# Patient Record
Sex: Male | Born: 1975 | Race: White | Hispanic: No | Marital: Married | Smoking: Never smoker
Health system: Southern US, Community
[De-identification: ages and names within clinical notes are randomized; demographics above are authoritative.]

## PROBLEM LIST (undated history)

## (undated) DIAGNOSIS — Z8619 Personal history of other infectious and parasitic diseases: Secondary | ICD-10-CM

## (undated) DIAGNOSIS — I499 Cardiac arrhythmia, unspecified: Secondary | ICD-10-CM

## (undated) DIAGNOSIS — N200 Calculus of kidney: Secondary | ICD-10-CM

## (undated) DIAGNOSIS — K219 Gastro-esophageal reflux disease without esophagitis: Secondary | ICD-10-CM

## (undated) HISTORY — DX: Gastro-esophageal reflux disease without esophagitis: K21.9

## (undated) HISTORY — DX: Personal history of other infectious and parasitic diseases: Z86.19

## (undated) HISTORY — DX: Calculus of kidney: N20.0

---
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
## 2011-05-22 ENCOUNTER — Ambulatory Visit: Payer: Self-pay | Admitting: Family Medicine

## 2011-06-05 ENCOUNTER — Ambulatory Visit (INDEPENDENT_AMBULATORY_CARE_PROVIDER_SITE_OTHER): Payer: BC Managed Care – PPO | Admitting: Family Medicine

## 2011-06-05 ENCOUNTER — Encounter: Payer: Self-pay | Admitting: Family Medicine

## 2011-06-05 DIAGNOSIS — R42 Dizziness and giddiness: Secondary | ICD-10-CM | POA: Insufficient documentation

## 2011-06-05 DIAGNOSIS — R000 Tachycardia, unspecified: Secondary | ICD-10-CM

## 2011-06-05 NOTE — Progress Notes (Signed)
New pt.  Inc in pulse with exercise, over last few years.  He cut out supplements and caffeine and that didn't help.  HR got up to ~190s with exercise.  He had a steady increase and then steady decrease in pulse with exercise and rest, respectively.  Occ chest pain, tightness, now that is very brief, a few seconds.  Can be nonexertional and this appears unrelated to the exercise/pulse issue above.  H/o heartburn. No syncope.  No history of early cardiac death in family.  No known CAD or valvular disorder in patient.  When he runs, he warms up on treadmill and then goes at about 7 miles an hour (he goes up for about 10 minutes).  No h/o asthma.  Nonsmoker.   Dizzy spells about 1 month ago.  Room spin when moving from laying to sitting.  Lasted ~2 days.  Now resolved.  Will happen occ, every few months.  No focal neuro changes.  Took dramamine, it made him sleepy.   PMH and SH reviewed  ROS: See HPI, otherwise noncontributory.  Meds, vitals, and allergies reviewed.   GEN: nad, alert and oriented HEENT: mucous membranes moist NECK: supple w/o LA CV: rrr.  no murmur PULM: ctab, no inc wob ABD: soft, +bs EXT: no edema SKIN: no acute rash CN 2-12 wnl B, S/S/DTR wnl x4 DHP wnl x2  EKG wnl.

## 2011-06-05 NOTE — Patient Instructions (Signed)
Keep exercising but double your warm up time and shoot for a max heart rate of ~160.  I think you have BPV (benign positional vertigo) and the bedside exercises can help. Let me know if you have other concerns.  Take care.

## 2011-06-06 ENCOUNTER — Encounter: Payer: Self-pay | Admitting: Family Medicine

## 2011-06-06 NOTE — Assessment & Plan Note (Signed)
Benign exam, likely BPV.  D/w pt about otc meds and bedside exercises.  F/u prn.

## 2011-06-06 NOTE — Assessment & Plan Note (Signed)
ekg wnl and normal exam.  I think this may be related to relative deconditioning and exacerbated by a brief warm up phase. Inc warm up time and plan for target max HR ~160 to keep pt in aerobic range and then f/u prn.  I think his is okay for exercise.  He'll call back if he has troubles.

## 2011-07-28 ENCOUNTER — Ambulatory Visit: Payer: BC Managed Care – PPO | Admitting: Family Medicine

## 2011-07-28 ENCOUNTER — Ambulatory Visit (INDEPENDENT_AMBULATORY_CARE_PROVIDER_SITE_OTHER): Payer: BC Managed Care – PPO | Admitting: Family Medicine

## 2011-07-28 ENCOUNTER — Encounter: Payer: Self-pay | Admitting: Family Medicine

## 2011-07-28 DIAGNOSIS — J069 Acute upper respiratory infection, unspecified: Secondary | ICD-10-CM

## 2011-07-28 NOTE — Patient Instructions (Signed)
Drink plenty of fluids, take tylenol as needed, and gargle with warm salt water for your throat.  Take theraflu in the meantime and warm up your voice before the show. This should gradually improve.  Take care.  Let us know if you have other concerns.

## 2011-07-28 NOTE — Assessment & Plan Note (Signed)
With cough, no exudates/LA and ST likely related to cough, this is highly unlikely to be strep.  Most likely viral process. Supportive tx, voice rest, gargle with salt water and get a flu shot when resolved.  He agrees.  ddx d/w pt.  He is well appearing.

## 2011-07-28 NOTE — Progress Notes (Signed)
duration of symptoms: ~3 days Rhinorrhea: yes Congestion: yes ear pain: no sore throat: yes, from cough Cough:yes, some production Myalgias:occ backaches, better now other concerns: some sinus pressure.  Chest sore with cough.  Dec in appetite.  No fevers known, but felt warm.  occ chills.   All sx worse at night.    ROS: See HPI.  Otherwise negative.    Meds, vitals, and allergies reviewed.   GEN: nad, alert and oriented HEENT: mucous membranes moist, TM w/o erythema, nasal epithelium mildly injected, OP with cobblestoning but no exudates NECK: supple w/o LA CV: rrr. PULM: ctab, no inc wob ABD: soft, +bs EXT: no edema

## 2011-08-15 ENCOUNTER — Ambulatory Visit: Payer: BC Managed Care – PPO | Admitting: Family Medicine

## 2011-12-23 ENCOUNTER — Ambulatory Visit (INDEPENDENT_AMBULATORY_CARE_PROVIDER_SITE_OTHER): Payer: BC Managed Care – PPO | Admitting: Family Medicine

## 2011-12-23 ENCOUNTER — Encounter: Payer: Self-pay | Admitting: Family Medicine

## 2011-12-23 VITALS — BP 118/80 | HR 88 | Temp 97.3°F | Wt 185.2 lb

## 2011-12-23 DIAGNOSIS — M7989 Other specified soft tissue disorders: Secondary | ICD-10-CM

## 2011-12-23 MED ORDER — NAPROXEN 500 MG PO TABS: ORAL_TABLET | ORAL | Status: DC

## 2011-12-23 NOTE — Progress Notes (Signed)
  Subjective:    Patient ID: Roger Reed, male    DOB: 11/30/1975, 36 y.o.   MRN: [id number]  HPI CC: check lump R arm  Plays guitar - for a living.  Last week 9 hours worth of playing straight (several shows), then noticed knot develop.  When plays worse, when stops playing feels better.  Feels some grind as day progresses but not in mornings.    Has tried ibuprofen, didn't really see improvement in size.  No ice so far.  Also works at [employer] shot - repetitive hand motions.  Never previous similar episode.  No recent skin infections, cuts, fevers, bruising, no spreading redness.  No erythema at all.  Review of Systems Per HPI    Objective:   Physical Exam  Nursing note and vitals reviewed. Constitutional: He appears well-developed and well-nourished. No distress.  Musculoskeletal: He exhibits no edema.       Swelling present right lateral distal forearm, sore to palpation. + finklestein's on right No pain with testing wrist flexors/extendors against resistance. FROM at wrists bilaterally. No pain at wrist, CMCs, 1st MCP or rest of hand. No anatomical snuffbox tenderness  Neurological: He has normal strength. No sensory deficit.       Assessment & Plan:

## 2011-12-23 NOTE — Assessment & Plan Note (Signed)
No injury. + repetitive motions.  Anticipate wrist extensor tendonitis.   Treat as such with ice, NSAIDs, rest. If not improving, consider forearm xray to r/o fx. Called and advised to use cockup wrist brace as well for further rest/protection.

## 2011-12-23 NOTE — Patient Instructions (Addendum)
I think you have wrist extensor tendinitis - treat with naprosyn twice daily for 5 days with food then as needed.  Rest arm as able. Ice to area as well. If not improving, let us know. Good to see you today, call us with quesitons.  Tendinitis Tendinitis is swelling and inflammation of the tendons. Tendons are band-like tissues that connect muscle to bone. Tendinitis commonly occurs in the:   Shoulders (rotator cuff).   Heels (Achilles tendon).   Elbows (triceps tendon).  CAUSES Tendinitis is usually caused by overusing the tendon, muscles, and joints involved. When the tissue surrounding a tendon (synovium) becomes inflamed, it is called tenosynovitis. Tendinitis commonly develops in people whose jobs require repetitive motions. SYMPTOMS  Pain.   Tenderness.   Mild swelling.  DIAGNOSIS Tendinitis is usually diagnosed by physical exam. Your caregiver may also order X-rays or other imaging tests. TREATMENT Your caregiver may recommend certain medicines or exercises for your treatment. HOME CARE INSTRUCTIONS   Use a sling or splint for as long as directed by your caregiver until the pain decreases.   Put ice on the injured area.   Put ice in a plastic bag.   Place a towel between your skin and the bag.   Leave the ice on for 15 to 20 minutes, 3 to 4 times a day.   Avoid using the limb while the tendon is painful. Perform gentle range of motion exercises only as directed by your caregiver. Stop exercises if pain or discomfort increase, unless directed otherwise by your caregiver.   Only take over-the-counter or prescription medicines for pain, discomfort, or fever as directed by your caregiver.  SEEK MEDICAL CARE IF:   Your pain and swelling increase.   You develop new, unexplained symptoms, especially increased numbness in the hands.  MAKE SURE YOU:   Understand these instructions.   Will watch your condition.   Will get help right away if you are not doing well or  get worse.  Document Released: 08/15/2000 Document Revised: 08/07/2011 Document Reviewed: 11/04/2010 [facility] Patient Information 2012 [place], [place].

## 2012-04-22 ENCOUNTER — Encounter: Payer: Self-pay | Admitting: Family Medicine

## 2012-04-22 ENCOUNTER — Ambulatory Visit (INDEPENDENT_AMBULATORY_CARE_PROVIDER_SITE_OTHER): Payer: BC Managed Care – PPO | Admitting: Family Medicine

## 2012-04-22 VITALS — BP 104/80 | HR 80 | Temp 98.2°F | Wt 184.0 lb

## 2012-04-22 DIAGNOSIS — M549 Dorsalgia, unspecified: Secondary | ICD-10-CM

## 2012-04-22 MED ORDER — PREDNISONE 20 MG PO TABS: ORAL_TABLET | ORAL | Status: AC

## 2012-04-22 MED ORDER — CYCLOBENZAPRINE HCL 10 MG PO TABS: 10.0000 mg | ORAL_TABLET | Freq: Three times a day (TID) | ORAL | Status: AC | PRN

## 2012-04-22 MED ORDER — TRAMADOL HCL 50 MG PO TABS: 50.0000 mg | ORAL_TABLET | Freq: Four times a day (QID) | ORAL | Status: AC | PRN

## 2012-04-22 NOTE — Progress Notes (Signed)
[facility] [address] [place] [address] Phone: [phone] Fax: [phone]  Date:  04/22/2012   Name:  Roger Reed   DOB:  05-30-1976   MRN:  [id number] Gender: male  Age: 36 y.o.  PCP:  Crawford Givens, MD    Chief Complaint: Pain across lower back for about a week and a half, since mo   History of Present Illness:  Roger Reed is a 36 y.o. very pleasant male patient who presents with the following:  Several years ago, hurt his back, and was moving and lifting some things out of the car, and missed worka couple of days, and has a back brace and on his feet all day long. Having to push off and hurts to lift  All in low back.  In buttocks.  He describes no numbness, tingling, or radiculopathy. He is having no bowel or bladder incontinence. He is having some significant amount of pain, and is limiting his ability to return to work and feel at full function.  He is able to move but is having some difficulty even standing from a chair and going up and down stairs.  He has been using a back brace and been doing some occasional stretches. Other than this he is tried a few anti-inflammatories, but otherwise has not done much  Past Medical History, Surgical History, Social History, Family History, Problem List, Medications, and Allergies have been reviewed and updated if relevant.  No current outpatient prescriptions on file prior to visit.    Review of Systems:  GEN: No fevers, chills. Nontoxic. Primarily MSK c/o today. MSK: Detailed in the HPI GI: tolerating PO intake without difficulty Neuro: No numbness, parasthesias, or tingling associated. Otherwise the pertinent positives of the ROS are noted above.    Physical Examination: Filed Vitals:   04/22/12 1206  BP: 104/80  Pulse: 80  Temp: 98.2 F (36.8 C)   Filed Vitals:   04/22/12 1206  Weight: 184 lb (83.462 kg)   There is no height on file to calculate BMI. Ideal Body  Weight:     GEN: Well-developed,well-nourished,in no acute distress; alert,appropriate and cooperative throughout examination HEENT: Normocephalic and atraumatic without obvious abnormalities. Ears, externally no deformities PULM: Breathing comfortably in no respiratory distress EXT: No clubbing, cyanosis, or edema PSYCH: Normally interactive. Cooperative during the interview. Pleasant. Friendly and conversant. Not anxious or depressed appearing. Normal, full affect.  Range of motion at  the waist: Mild restriction in all directions, the patient is able to achieve approximately 60 of forward flexion. Extension causes some pain.  No echymosis or edema Rises to examination table with mild difficulty Gait: non antalgic  Inspection/Deformity: N Paraspinus Tenderness: l3-s1  B Ankle Dorsiflexion (L5,4): 5/5 B Great Toe Dorsiflexion (L5,4): 5/5 Heel Walk (L5): WNL Toe Walk (S1): WNL Rise/Squat (L4): arms to assist, some pain  SENSORY B Medial Foot (L4): WNL B Dorsum (L5): WNL B Lateral (S1): WNL Light Touch: WNL Pinprick: WNL  REFLEXES Knee (L4): 2+ Ankle (S1): 2+  B SLR, seated: neg B SLR, supine: neg B FABER: neg B Reverse FABER: neg B Greater Troch: NT B Log Roll: neg B Stork: NT B Sciatic Notch: TTP   Assessment and Plan:  1. Back pain    No red flags, we'll treat conservatively at this point. The patient would like to be relatively aggressive if at all possible so he can return to full function as soon as possible. I may give  him a ten-day burst taper of steroids at 40/20 of prednisone, some tramadol as needed, and Flexeril at nighttime.  He is going to followup with me in a few weeks if he is not improved  Orders Today:  No orders of the defined types were placed in this encounter.    Medications Today: (Includes new updates added during medication reconciliation) Meds ordered this encounter  Medications  . predniSONE (DELTASONE) 20 MG tablet    Sig: 2  tabs po for 5 days, then 1 po for 5 days    Dispense:  15 tablet    Refill:  0  . cyclobenzaprine (FLEXERIL) 10 MG tablet    Sig: Take 1 tablet (10 mg total) by mouth 3 (three) times daily as needed for muscle spasms.    Dispense:  30 tablet    Refill:  2  . traMADol (ULTRAM) 50 MG tablet    Sig: Take 1 tablet (50 mg total) by mouth every 6 (six) hours as needed for pain.    Dispense:  50 tablet    Refill:  0    Medications Discontinued: Medications Discontinued During This Encounter  Medication Reason  . naproxen (NAPROSYN) 500 MG tablet Error     Hannah Beat, MD

## 2012-05-04 ENCOUNTER — Encounter: Payer: Self-pay | Admitting: Family Medicine

## 2012-05-04 ENCOUNTER — Ambulatory Visit (INDEPENDENT_AMBULATORY_CARE_PROVIDER_SITE_OTHER): Payer: BC Managed Care – PPO | Admitting: Family Medicine

## 2012-05-04 VITALS — BP 110/70 | HR 100 | Temp 98.1°F | Wt 178.5 lb

## 2012-05-04 DIAGNOSIS — M549 Dorsalgia, unspecified: Secondary | ICD-10-CM

## 2012-05-04 MED ORDER — IBUPROFEN 200 MG PO TABS: 600.0000 mg | ORAL_TABLET | Freq: Three times a day (TID) | ORAL | Status: AC | PRN

## 2012-05-04 MED ORDER — HYDROCODONE-ACETAMINOPHEN 5-325 MG PO TABS: 1.0000 | ORAL_TABLET | Freq: Four times a day (QID) | ORAL | Status: AC | PRN

## 2012-05-04 NOTE — Progress Notes (Signed)
Was moving equipment and felt back pain while lifting.  Was leaning over and lifting.  Quick onset of pain.  Prev seen by Dr. Patsy Lager and was improved with tramadol, flexeril and prednisone.  Now off the prednisone and pain has returned.  The current pain is the same as prev at [facility] with Dr. Patsy Lager.  He's been driving (long road trip) and he pain increased after that.  No bowel/bladder dysfxn.  No leg weakness but pain limits ROM.  No radicular pain in legs.  Pain is in B lower back, near the tailbone.    Meds, vitals, and allergies reviewed.   ROS: See HPI.  Otherwise, noncontributory.  nad but uncomfortable rrr ctab Back w/o midline pain except at the lower L spine.  B paraspinal muscle tenderness in L spine with neg SLR No rash No weakness in BLE

## 2012-05-04 NOTE — Patient Instructions (Signed)
Keep stretching, take the vicodin and ibuprofen for pain.  See Shirlee Limerick about your referral before you leave today. Take care.

## 2012-05-05 DIAGNOSIS — M549 Dorsalgia, unspecified: Secondary | ICD-10-CM | POA: Insufficient documentation

## 2012-05-05 NOTE — Assessment & Plan Note (Signed)
Worsened, will use vicodin with sedation caution in meantime and refer to ortho.  Pt agrees with plan.  I will defer imaging to ortho.

## 2012-10-22 ENCOUNTER — Emergency Department: Payer: Self-pay | Admitting: Emergency Medicine

## 2012-10-22 LAB — COMPREHENSIVE METABOLIC PANEL
Albumin: 5 g/dL (ref 3.4–5.0)
Anion Gap: 9 (ref 7–16)
Bilirubin,Total: 0.9 mg/dL (ref 0.2–1.0)
Calcium, Total: 9.7 mg/dL (ref 8.5–10.1)
Co2: 23 mmol/L (ref 21–32)
EGFR (African American): >60
Glucose: 160 mg/dL — ABNORMAL HIGH (ref 65–99)

## 2012-10-22 LAB — CBC
MCHC: 34.4 g/dL (ref 32.0–36.0)
MCV: 89 fL (ref 80–100)

## 2012-11-25 ENCOUNTER — Encounter: Payer: Self-pay | Admitting: Family Medicine

## 2012-11-25 ENCOUNTER — Telehealth: Payer: Self-pay | Admitting: *Deleted

## 2012-11-25 ENCOUNTER — Ambulatory Visit: Payer: BC Managed Care – PPO | Admitting: Family Medicine

## 2012-11-25 ENCOUNTER — Ambulatory Visit (INDEPENDENT_AMBULATORY_CARE_PROVIDER_SITE_OTHER): Payer: BC Managed Care – PPO | Admitting: Family Medicine

## 2012-11-25 VITALS — BP 140/100 | HR 88 | Temp 97.9°F | Wt 176.5 lb

## 2012-11-25 DIAGNOSIS — N200 Calculus of kidney: Secondary | ICD-10-CM

## 2012-11-25 DIAGNOSIS — R109 Unspecified abdominal pain: Secondary | ICD-10-CM

## 2012-11-25 LAB — POCT URINALYSIS DIPSTICK
Ketones, UA: NEGATIVE
Protein, UA: 30
Spec Grav, UA: 1.02
Urobilinogen, UA: 0.2
pH, UA: 6.5

## 2012-11-25 MED ORDER — TAMSULOSIN HCL 0.4 MG PO CAPS: 0.4000 mg | ORAL_CAPSULE | Freq: Every day | ORAL | Status: DC

## 2012-11-25 MED ORDER — NAPROXEN 500 MG PO TABS: ORAL_TABLET | ORAL | Status: DC

## 2012-11-25 MED ORDER — HYDROCODONE-ACETAMINOPHEN 5-325 MG PO TABS: 1.0000 | ORAL_TABLET | Freq: Four times a day (QID) | ORAL | Status: DC | PRN

## 2012-11-25 NOTE — Patient Instructions (Signed)
You have repeat kidney stone. Treat with daily flomax for next several days until you pass stone, Naprosyn anti inflammatory with food twice daily for next several days. vicodin as needed for breakthrough pain - don't drive with this. Strain urine. Please let us know if not improving as expected. If worsening pain or other concerning symptoms like fever, nausea/vomiting unable to keep food down, please seek urgent care.  Kidney Stones Kidney stones (ureteral lithiasis) are deposits that form inside your kidneys. The intense pain is caused by the stone moving through the urinary tract. When the stone moves, the ureter goes into spasm around the stone. The stone is usually passed in the urine.  CAUSES   A disorder that makes certain neck glands produce too much parathyroid hormone (primary hyperparathyroidism).  A buildup of uric acid crystals.  Narrowing (stricture) of the ureter.  A kidney obstruction present at birth (congenital obstruction).  Previous surgery on the kidney or ureters.  Numerous kidney infections. SYMPTOMS   Feeling sick to your stomach (nauseous).  Throwing up (vomiting).  Blood in the urine (hematuria).  Pain that usually spreads (radiates) to the groin.  Frequency or urgency of urination. DIAGNOSIS   Taking a history and physical exam.  Blood or urine tests.  Computerized X-ray scan (CT scan).  Occasionally, an examination of the inside of the urinary bladder (cystoscopy) is performed. TREATMENT   Observation.  Increasing your fluid intake.  Surgery may be needed if you have severe pain or persistent obstruction. The size, location, and chemical composition are all important variables that will determine the proper choice of action for you. Talk to your caregiver to better understand your situation so that you will minimize the risk of injury to yourself and your kidney.  HOME CARE INSTRUCTIONS   Drink enough water and fluids to keep your  urine clear or pale yellow.  Strain all urine through the provided strainer. Keep all particulate matter and stones for your caregiver to see. The stone causing the pain may be as small as a grain of salt. It is very important to use the strainer each and every time you pass your urine. The collection of your stone will allow your caregiver to analyze it and verify that a stone has actually passed.  Only take over-the-counter or prescription medicines for pain, discomfort, or fever as directed by your caregiver.  Make a follow-up appointment with your caregiver as directed.  Get follow-up X-rays if required. The absence of pain does not always mean that the stone has passed. It may have only stopped moving. If the urine remains completely obstructed, it can cause loss of kidney function or even complete destruction of the kidney. It is your responsibility to make sure X-rays and follow-ups are completed. Ultrasounds of the kidney can show blockages and the status of the kidney. Ultrasounds are not associated with any radiation and can be performed easily in a matter of minutes. SEEK IMMEDIATE MEDICAL CARE IF:   Pain cannot be controlled with the prescribed medicine.  You have a fever.  The severity or intensity of pain increases over 18 hours and is not relieved by pain medicine.  You develop a new onset of abdominal pain.  You feel faint or pass out. MAKE SURE YOU:   Understand these instructions.  Will watch your condition.  Will get help right away if you are not doing well or get worse. Document Released: 08/18/2005 Document Revised: 11/10/2011 Document Reviewed: 12/14/2009 [facility] Patient Information 2013 [place],  [employer].  

## 2012-11-25 NOTE — Telephone Encounter (Signed)
Patient's wife says they think pt has a kidney stone.  First appt available is 3 pm.  They are asking if they could come sooner?  Please advise.

## 2012-11-25 NOTE — Assessment & Plan Note (Signed)
See pt instructions for plan - NSAID, narcotic, flomax, strainer. Push fluids. Discussed treatment plan and red flags to seek urgent care. H/o kidney stones in past.

## 2012-11-25 NOTE — Telephone Encounter (Signed)
Does anyone else have a spot? O/w 12:30 add on.

## 2012-11-25 NOTE — Telephone Encounter (Signed)
I spoke with patient's wife and scheduled appointment today with Dr.Gutierrez at 11:15.

## 2012-11-25 NOTE — Progress Notes (Signed)
  Subjective:    Patient ID: Roger Reed, male    DOB: 02/17/1976, 37 y.o.   MRN: [id number]  HPI CC: ?kidney stone  Pleasant 37 yo with h/o nephrolithiasis (last 2007) presents with 1d h/o dull ache that started in R testicle/groin that radiated to R side.  Intermittent in nature.  Prior kidney stone presented similarly.  Prior stone passed on its own.  Noticing weakened stream.  Has voided x2 today.  Pain causes nausea.  Treating with naproxyn 500mg .  No hematuria.  No dysuria.  No fevers/chills.    BP elevated today, in pain.  Trying to drink more water.  Trying to cut back on mountain dew.  Has been drinking more herbal tea recently.  Past Medical History  Diagnosis Date  . GERD (gastroesophageal reflux disease)   . Kidney stones   . History of chicken pox      Review of Systems Per HPI    Objective:   Physical Exam  Nursing note and vitals reviewed. Constitutional: He appears well-developed and well-nourished. No distress.  Abdominal: Normal appearance and bowel sounds are normal. He exhibits no distension and no mass. There is no hepatosplenomegaly. There is no tenderness. There is no rebound, no guarding and no CVA tenderness.  Musculoskeletal: He exhibits no edema.       Assessment & Plan:

## 2012-12-13 ENCOUNTER — Other Ambulatory Visit: Payer: Self-pay

## 2012-12-13 MED ORDER — HYDROCODONE-ACETAMINOPHEN 5-325 MG PO TABS: 1.0000 | ORAL_TABLET | Freq: Four times a day (QID) | ORAL | Status: DC | PRN

## 2012-12-13 NOTE — Telephone Encounter (Signed)
Please call in.   If not passed/improved/resolved, then needs recheck.

## 2012-12-13 NOTE — Telephone Encounter (Signed)
Pt request refill on hydrocodone apap; on and off pt still has RLQ pain in abdomen; not sure if passed stone or not.[facility].Please advise.

## 2012-12-14 NOTE — Telephone Encounter (Signed)
LMOVM to call for appt if no improvement.

## 2013-01-26 ENCOUNTER — Other Ambulatory Visit: Payer: Self-pay

## 2013-01-26 MED ORDER — HYDROCODONE-ACETAMINOPHEN 5-325 MG PO TABS: 1.0000 | ORAL_TABLET | Freq: Four times a day (QID) | ORAL | Status: DC | PRN

## 2013-01-26 MED ORDER — NAPROXEN 500 MG PO TABS: ORAL_TABLET | ORAL | Status: DC

## 2013-01-26 MED ORDER — TAMSULOSIN HCL 0.4 MG PO CAPS: 0.4000 mg | ORAL_CAPSULE | Freq: Every day | ORAL | Status: DC

## 2013-01-26 NOTE — Telephone Encounter (Signed)
Spoke with patient. Never passed stone after seen here 11/25/2012 for nephrolithiasis but his sxs resolved a few days afterwards.  Will treat presumed kidney stone with another course of vicodin, flomax, and naprosyn. Discussed will need eval next week if persistent sxs/pain past weekend.   To seek urgent care if fever or trouble voiding or uncontrolled pain. Pt agrees with plan.

## 2013-01-26 NOTE — Telephone Encounter (Signed)
pts wife said pt has hx of kidney stones; on 01/25/13 pt began to hurt like a kidney stone, no fever (Mrs. Vanscyoc is not sure where pain is; pt is at work and has had so many kidney stones Mrs. Frick said he knows when he is getting a kidney stone).No available appt today. Pt is going to [place] 01/27/13 in early AM and will not be back until the weekend. Pt's wife request refill hydrocodone apap to [facility].Pt has a strainer for urine. Mrs. Comas request call back.

## 2013-01-26 NOTE — Telephone Encounter (Signed)
pts wife called back to ck status of pain med.Please advise.

## 2013-02-15 ENCOUNTER — Ambulatory Visit (INDEPENDENT_AMBULATORY_CARE_PROVIDER_SITE_OTHER): Payer: BC Managed Care – PPO | Admitting: Family Medicine

## 2013-02-15 ENCOUNTER — Encounter: Payer: Self-pay | Admitting: Family Medicine

## 2013-02-15 ENCOUNTER — Other Ambulatory Visit: Payer: Self-pay

## 2013-02-15 VITALS — BP 128/82 | HR 88 | Temp 98.0°F | Wt 174.8 lb

## 2013-02-15 DIAGNOSIS — R109 Unspecified abdominal pain: Secondary | ICD-10-CM | POA: Insufficient documentation

## 2013-02-15 DIAGNOSIS — R1031 Right lower quadrant pain: Secondary | ICD-10-CM | POA: Insufficient documentation

## 2013-02-15 DIAGNOSIS — N200 Calculus of kidney: Secondary | ICD-10-CM

## 2013-02-15 LAB — POCT URINALYSIS DIPSTICK
Bilirubin, UA: NEGATIVE
Ketones, UA: NEGATIVE
Nitrite, UA: NEGATIVE
Protein, UA: NEGATIVE
pH, UA: 6.5

## 2013-02-15 MED ORDER — NAPROXEN 500 MG PO TABS: ORAL_TABLET | ORAL | Status: DC

## 2013-02-15 MED ORDER — CIPROFLOXACIN HCL 500 MG PO TABS: 500.0000 mg | ORAL_TABLET | Freq: Two times a day (BID) | ORAL | Status: DC

## 2013-02-15 MED ORDER — HYDROCODONE-ACETAMINOPHEN 5-325 MG PO TABS: 1.0000 | ORAL_TABLET | Freq: Four times a day (QID) | ORAL | Status: DC | PRN

## 2013-02-15 MED ORDER — TAMSULOSIN HCL 0.4 MG PO CAPS: 0.4000 mg | ORAL_CAPSULE | Freq: Every day | ORAL | Status: DC

## 2013-02-15 NOTE — Telephone Encounter (Signed)
Pt's wife left v/m; pt has another kidney stone; has appt today at 4:15 pm but request hydrocodone apap to [facility] until seen.Please advise.

## 2013-02-15 NOTE — Telephone Encounter (Signed)
plz phone in while pt comes in this afternoon.

## 2013-02-15 NOTE — Progress Notes (Signed)
  Subjective:    Patient ID: Roger Reed, male    DOB: 1976/08/03, 37 y.o.   MRN: [id number]  HPI CC: recurrent kidney stone  Seen here 10/2012 with recurrent R kidney stone (prior one was in 2007).  Treated with flomax, naprosyn, vicodin for breakthrough pain, and strainer. Never passed stone that he knows of but symptoms resolved 2 days later.  Since March has had R sided discomfort off and on.  This morning started having sxs again - a bit different than prior.  Along with R lower abdominal discomfort, also endorsing som burning on that side as well.  Noticed gross blood this morning as well.  Having radiation down into groin.  Constant ache present. Denies dysuria, urgency, frequency.  No fevers/chills, nausea/vomiting, back or flank pain.  Has increased fluid intake.  Also trying to decrease soda intake.  No results found for this basename: CREATININE   Past Medical History  Diagnosis Date  . GERD (gastroesophageal reflux disease)   . Kidney stones   . History of chicken pox     Review of Systems Per HPI    Objective:   Physical Exam  Nursing note and vitals reviewed. Constitutional: He appears well-developed and well-nourished. No distress.  Abdominal: Soft. Normal appearance and bowel sounds are normal. He exhibits no distension and no mass. There is no hepatosplenomegaly. There is tenderness (mild) in the right lower quadrant. There is no rigidity, no rebound, no guarding, no CVA tenderness and negative Murphy's sign.       Assessment & Plan:

## 2013-02-15 NOTE — Patient Instructions (Signed)
Urine culture sent today. Blood work today. Pass by Marion's office for referral for CT of abdomen to eval for kidney stones. Restart naprosyn for discomfort, as well as flomax and hydrocodone for breakthrough pain. Strain urine.

## 2013-02-15 NOTE — Assessment & Plan Note (Signed)
With gross hematuria today, in h/o kidney stones.   Endorses intermittent pain over last several months. Check UA today - large blood with 1-5 RBC /hpf.  Also with 1-5 WBC/hpf - sent off culture today and will start cipro to cover possible infection. I will also check Cr, Ca and WBC today, and given chronicity of symptoms, will obtain abdominal/pelvic CT without contrast to eval for multiple R stones and r/o any hydronephrosis present. In interim, treat pain with naprosyn, flomax, and hydrocodone for breakthrough pain.  Strainer provided today. Pt agrees with plan.

## 2013-02-15 NOTE — Telephone Encounter (Signed)
Rx called in as directed.   

## 2013-02-16 ENCOUNTER — Other Ambulatory Visit: Payer: Self-pay | Admitting: Family Medicine

## 2013-02-16 ENCOUNTER — Ambulatory Visit (INDEPENDENT_AMBULATORY_CARE_PROVIDER_SITE_OTHER)
Admission: RE | Admit: 2013-02-16 | Discharge: 2013-02-16 | Disposition: A | Discharge status: Home / Self Care | Payer: BC Managed Care – PPO | Source: Ambulatory Visit | Attending: Family Medicine | Admitting: Family Medicine

## 2013-02-16 DIAGNOSIS — N132 Hydronephrosis with renal and ureteral calculous obstruction: Secondary | ICD-10-CM

## 2013-02-16 DIAGNOSIS — N200 Calculus of kidney: Secondary | ICD-10-CM

## 2013-02-16 LAB — CBC WITH DIFFERENTIAL/PLATELET
Eosinophils Relative: 2 % (ref 0.0–5.0)
HCT: 41.8 % (ref 39.0–52.0)
Hemoglobin: 14.2 g/dL (ref 13.0–17.0)
Lymphs Abs: 2.6 10*3/uL (ref 0.7–4.0)
Monocytes Relative: 6.7 % (ref 3.0–12.0)
Neutro Abs: 5.3 10*3/uL (ref 1.4–7.7)
RBC: 4.62 Mil/uL (ref 4.22–5.81)
WBC: 8.8 10*3/uL (ref 4.5–10.5)

## 2013-02-16 LAB — BASIC METABOLIC PANEL
GFR: 69.28 mL/min (ref >60.00)
Potassium: 3.7 mEq/L (ref 3.5–5.1)
Sodium: 139 mEq/L (ref 135–145)

## 2013-02-17 ENCOUNTER — Other Ambulatory Visit: Payer: Self-pay | Admitting: Urology

## 2013-02-17 ENCOUNTER — Telehealth: Payer: Self-pay

## 2013-02-17 LAB — URINE CULTURE
Colony Count: NO GROWTH
Organism ID, Bacteria: NO GROWTH

## 2013-02-17 NOTE — Telephone Encounter (Signed)
Pt request 02/15/13 lab results; Patient notified as instructed by telephone. Noted after read Dr Timoteo Expose comment that information is on mychart.

## 2013-02-18 ENCOUNTER — Encounter (HOSPITAL_COMMUNITY): Payer: Self-pay | Admitting: *Deleted

## 2013-02-18 ENCOUNTER — Encounter (HOSPITAL_COMMUNITY): Payer: Self-pay | Admitting: Pharmacy Technician

## 2013-02-18 ENCOUNTER — Telehealth: Payer: Self-pay | Admitting: Family Medicine

## 2013-02-18 ENCOUNTER — Encounter: Payer: Self-pay | Admitting: Family Medicine

## 2013-02-18 NOTE — Telephone Encounter (Signed)
plz send urine culture results to [facility] attention Dr. Brunilda Payor.

## 2013-02-18 NOTE — Progress Notes (Signed)
Instructed patient to arrive at short stay at 8am on Monday for EKG. Then retrun at 1530 for Lithotripsy. Called Dr. Madilyn Hook office to leth them know doctor will need to call [facility] to have EKG read prior to Lithotripsy.

## 2013-02-18 NOTE — Progress Notes (Signed)
Patient at [facility] as we speak. Patient states he has history of rapid irregular heart rate with exercise EKG 2012.  That EKG was normal and states no chest pain or problem since.

## 2013-02-18 NOTE — Progress Notes (Signed)
Spoke with Vernona Rieger (Dr. Madilyn Hook nurse) she will get the message to Dr. Brunilda Payor re: EKG and getting it read on Monday prior to [facility].

## 2013-02-21 ENCOUNTER — Ambulatory Visit (HOSPITAL_COMMUNITY)
Admission: RE | Admit: 2013-02-21 | Discharge: 2013-02-21 | Disposition: A | Discharge status: Home / Self Care | Payer: BC Managed Care – PPO | Source: Ambulatory Visit | Attending: Urology | Admitting: Urology

## 2013-02-21 ENCOUNTER — Encounter (HOSPITAL_COMMUNITY): Payer: Self-pay | Admitting: *Deleted

## 2013-02-21 ENCOUNTER — Other Ambulatory Visit: Payer: Self-pay

## 2013-02-21 ENCOUNTER — Encounter (HOSPITAL_COMMUNITY)
Admission: RE | Disposition: A | Discharge status: Home / Self Care | Payer: Self-pay | Source: Ambulatory Visit | Attending: Urology

## 2013-02-21 DIAGNOSIS — I499 Cardiac arrhythmia, unspecified: Secondary | ICD-10-CM | POA: Insufficient documentation

## 2013-02-21 DIAGNOSIS — N201 Calculus of ureter: Secondary | ICD-10-CM | POA: Insufficient documentation

## 2013-02-21 HISTORY — DX: Cardiac arrhythmia, unspecified: I49.9

## 2013-02-21 SURGERY — LITHOTRIPSY, ESWL
Anesthesia: LOCAL | Laterality: Right

## 2013-02-21 MED ORDER — DIAZEPAM 5 MG PO TABS
10.0000 mg | ORAL_TABLET | ORAL | Status: AC
  Administered 2013-02-21: 10 mg via ORAL
  Filled 2013-02-21: qty 2

## 2013-02-21 MED ORDER — CIPROFLOXACIN HCL 500 MG PO TABS
500.0000 mg | ORAL_TABLET | ORAL | Status: AC
  Administered 2013-02-21: 500 mg via ORAL
  Filled 2013-02-21: qty 1

## 2013-02-21 MED ORDER — HYDROCODONE-ACETAMINOPHEN 5-325 MG PO TABS: 1.0000 | ORAL_TABLET | Freq: Four times a day (QID) | ORAL | Status: DC | PRN

## 2013-02-21 MED ORDER — DIPHENHYDRAMINE HCL 25 MG PO CAPS
25.0000 mg | ORAL_CAPSULE | ORAL | Status: AC
  Administered 2013-02-21: 25 mg via ORAL
  Filled 2013-02-21: qty 1

## 2013-02-21 MED ORDER — DEXTROSE-NACL 5-0.45 % IV SOLN
INTRAVENOUS | Status: DC
  Administered 2013-02-21: 16:00:00 via INTRAVENOUS

## 2013-02-21 NOTE — H&P (Signed)
History of Present Illness  Roger Reed is seen at Dr Eustaquio Boyden' request.  He has been complaining of right sided abdominal pain on and off for the past 2-3 months.  The pain usually lasts 2-3 days and subsides on its own.  Two days ago he saw blood in his urine and went to see Dr Sharen Hones who requested a CT scan. It revealed moderate right hydronephrosis secondary to a 7 mm mid ureteral calculus.  He has a past history of kidney stone.  He had his first stone about ten years ago.  He usually passes the stones.  He is not in any discomfort at this time.  He is on Flomax, cipro and hydrocodone.  Urinalysis today is negative.   Past Medical History Problems  1. History of  Esophageal Reflux 530.81  Surgical History Problems  1. History of  Tonsillectomy  Current Meds 1. Ciprofloxacin HCl 500 MG Oral Tablet; Therapy: 17Jun2014 to 2. Hydrocodone-Acetaminophen 5-325 MG Oral Tablet; Therapy: 15Apr2014 to 3. Tamsulosin HCl 0.4 MG Oral Capsule; Therapy: 27Mar2014 to  Allergies Medication  1. No Known Drug Allergies  Family History Problems  1. Family history of  Family Health Status Number Of Children 2. Family history of  Family Health Status Of Father - Alive 3. Family history of  Family Health Status Of Mother - Alive  Social History Problems    Alcohol Use   Caffeine Use   Marital History - Currently Married   Never A Smoker  Review of Systems Genitourinary, constitutional, skin, eye, otolaryngeal, hematologic/lymphatic, cardiovascular, pulmonary, endocrine, musculoskeletal, gastrointestinal, neurological and psychiatric system(s) were reviewed and pertinent findings if present are noted.  Genitourinary: hematuria.  Gastrointestinal: abdominal pain and heartburn.    Vitals Vital Signs [Data Includes: Last 1 Day]  19Jun2014 11:12AM  BMI Calculated: 25.77 BSA Calculated: 1.94 Height: 5 ft 9 in Weight: 174 lb  Blood Pressure: 129 / 81 Temperature: 97 F Heart Rate:  89  Physical Exam Constitutional: Well nourished and well developed . No acute distress.  ENT:. The ears and nose are normal in appearance.  Neck: The appearance of the neck is normal and no neck mass is present.  Pulmonary: No respiratory distress and normal respiratory rhythm and effort.  Cardiovascular: Heart rate and rhythm are normal . No peripheral edema.  Abdomen: The abdomen is soft and nontender. No masses are palpated. No CVA tenderness. No hernias are palpable. No hepatosplenomegaly noted.  Genitourinary: Examination of the penis demonstrates no discharge, no masses, no lesions and a normal meatus. The scrotum is without lesions. The right epididymis is palpably normal and non-tender. The left epididymis is palpably normal and non-tender. The right testis is non-tender and without masses. The left testis is non-tender and without masses.  Lymphatics: The femoral and inguinal nodes are not enlarged or tender.  Skin: Normal skin turgor, no visible rash and no visible skin lesions.  Neuro/Psych:. Mood and affect are appropriate.    Results/Data Urine [Data Includes: Last 1 Day]   19Jun2014  COLOR YELLOW   APPEARANCE CLEAR   SPECIFIC GRAVITY 1.025   pH 6.0   GLUCOSE NEG mg/dL  BILIRUBIN NEG   KETONE NEG mg/dL  BLOOD NEG   PROTEIN NEG mg/dL  UROBILINOGEN 0.2 mg/dL  NITRITE NEG   LEUKOCYTE ESTERASE NEG     I independently reviewed the CT scan and the findings are as noted in the HPI.   Assessment Assessed  1. Mid Ureteral Stone On The Right 592.1 2. Hydronephrosis  On The Right 591  Plan Health Maintenance (V70.0)  1. UA With REFLEX  Done: 19Jun2014 11:00AM   I discussed the treatment options with Roger Innes and his wife.  They are cystoscopy with ureteroscopy versus ESL.  The risks, benefits of each option were reviewed in detail.  Since he is asymptomatic at this time they elect to proceed with ESL.  The risks of ESL include but are not limited to hemorrhage, renal or  perirenal hematoma, injury to adjacent organs, steinstrasse, inability to fragment the stone. They understand and are agreeable.

## 2013-02-21 NOTE — Progress Notes (Addendum)
Pt is scheduled for ESWL today at 1700. He arrives now to have a pre-procedure EKG for history of arrythmias. He states he took an Geneticist, molecular ( that contains aspirin) on Saturday 02/19/2013 at 1530 which is only 50 hours prior to procedure. [facility]  requires holding all aspirin products for 72 hours prior to procedure. Spoke to [employer] and [employer] on the [employer] truck and they said" doing the ESWL today would depend on where the stone is located". Consent says"'right mid ureteral calculus" but they still asked that I get a KUB to confirm location. Spoke with Dr Brunilda Payor to inform him of this. KUB was done and viewed on truck by [facility] and procedure is to proceed to today as scheduled. Also beeped Dr Brunilda Payor to ask that he contact cardiology at [facility] to request that the EKG that was done today be read ASAP.

## 2013-02-21 NOTE — Op Note (Signed)
Refer to [facility] Op Note scanned in the chart 

## 2013-02-22 NOTE — Telephone Encounter (Signed)
Faxed as directed. 

## 2013-07-07 ENCOUNTER — Other Ambulatory Visit: Payer: Self-pay

## 2013-07-13 ENCOUNTER — Telehealth: Payer: Self-pay

## 2013-07-13 ENCOUNTER — Encounter: Payer: Self-pay | Admitting: Family Medicine

## 2013-07-13 ENCOUNTER — Ambulatory Visit (INDEPENDENT_AMBULATORY_CARE_PROVIDER_SITE_OTHER)
Admission: RE | Admit: 2013-07-13 | Discharge: 2013-07-13 | Disposition: A | Discharge status: Home / Self Care | Payer: BC Managed Care – PPO | Source: Ambulatory Visit | Attending: Family Medicine | Admitting: Family Medicine

## 2013-07-13 ENCOUNTER — Ambulatory Visit (INDEPENDENT_AMBULATORY_CARE_PROVIDER_SITE_OTHER): Payer: BC Managed Care – PPO | Admitting: Family Medicine

## 2013-07-13 VITALS — BP 104/74 | HR 92 | Temp 98.2°F | Wt 173.5 lb

## 2013-07-13 DIAGNOSIS — N200 Calculus of kidney: Secondary | ICD-10-CM

## 2013-07-13 LAB — POCT URINALYSIS DIPSTICK
Bilirubin, UA: NEGATIVE
Blood, UA: NEGATIVE
Glucose, UA: NEGATIVE
Ketones, UA: NEGATIVE
Spec Grav, UA: <=1.005

## 2013-07-13 MED ORDER — HYDROCODONE-ACETAMINOPHEN 5-325 MG PO TABS: 1.0000 | ORAL_TABLET | Freq: Four times a day (QID) | ORAL | Status: DC | PRN

## 2013-07-13 MED ORDER — TAMSULOSIN HCL 0.4 MG PO CAPS: 0.4000 mg | ORAL_CAPSULE | Freq: Every day | ORAL | Status: DC

## 2013-07-13 NOTE — Telephone Encounter (Signed)
I'm okay filling this but I won't be there until 2PM.  If G wants to fill in the meantime, then I support that.   If he gets the rx filled today, can cancel the appointment with me but if not improved will need f/u.   We can't call in the vicodin.

## 2013-07-13 NOTE — Telephone Encounter (Signed)
Patient Information:  Caller Name: [facility]  Phone: [phone]  Patient: Roger Reed, Roger Reed  Gender: Male  DOB: 11-12-1975  Age: 37 Years  PCP: Eustaquio Boyden [facility])  Office Follow Up:  Does the office need to follow up with this patient?: Yes  Instructions For The Office: Please confirm OK to wait until 1800 appointment.  RN Note:  Intermittent spasms of sharp right flank and hip/groin pain rated 6-7/10. Currently feels dull aching (rated 2-3/10) in right flank from hip to groin. Office has already scheduled appointment for 1800 07/13/13; thinks if he just gets Rx for pain medication and tries to pass it on his own that is all that is really needed.  Will do what ever MD recommends.  Per Epic note from Dr Sharen Hones, informed Rx for pain medication ready for pick up and to keep appointment at 1800 07/13/13.  Notified Kim/office nurse regarding triage disposition is see ED now or office now with PCP approval.  Symptoms  Reason For Call & Symptoms: Concerned about possible kidney stone.  Conferenced with pt at [phone].  Intermittent, pulsating pain in right flank with pain in abdomen between hip and groin.  Feels "like was kicked in testicles."  Reported some hesitency and weak urine stream during painful episode 07/12/13 at 2100.  Reviewed Health History In EMR: Yes  Reviewed Medications In EMR: Yes  Reviewed Allergies In EMR: Yes  Reviewed Surgeries / Procedures: Yes  Date of Onset of Symptoms: 06/29/2013  Treatments Tried: Naproxen  Treatments Tried Worked: No  Guideline(s) Used:  Flank Pain  Disposition Per Guideline:   Go to ED Now (or to Office with PCP Approval)  Reason For Disposition Reached:   Pain radiates into groin, scrotum  Advice Given:  Cold or Heat:  Cold Pack: For pain or swelling, use a cold pack or ice wrapped in a wet cloth. Put it on the sore area for 20 minutes. Repeat 4 times on the first day, then as needed.  Pain Medicines:  Naproxen  (e.g., Aleve):  Take 220 mg (one 220 mg pill) by mouth every 8 hours as needed. You may take 440 mg (two 220 mg pills) for your first dose.  The most you should take each day is 660 mg (three 220 mg pills a day), unless your doctor has told you to take more.  Call Back If:  Fever over 100.5 F (38.1 C)  Burning with urination or blood in urine  You become worse.  RN Overrode Recommendation:  Patient Already Has Appt, Document Patient  Notified Kim/office nurse that appointment was scheduled for 1800; disposition is ED now or office wtih PCP approval.

## 2013-07-13 NOTE — Telephone Encounter (Signed)
I've printed narcotic script and placed in Roger Reed's box for patient to at least start some pain relief - but I do recommend keeping appointment with PCP for tonight H/o recurrent kidney stones, including h/o one stone that was 10mm in size with uro eval and stent placement in past - may need to start preventative therapy (per prior Uro note).

## 2013-07-13 NOTE — Telephone Encounter (Signed)
Patient notified as instructed by telephone. Was advised by patient that he will keep the appointment scheduled for later today.

## 2013-07-13 NOTE — Telephone Encounter (Signed)
If the pain is such that he can't wait until tonight, then go to ER.  O/w keep the appointment here tonight.

## 2013-07-13 NOTE — Telephone Encounter (Signed)
Patient notified as instructed by telephone. 

## 2013-07-13 NOTE — Telephone Encounter (Signed)
The patient's wife called hoping to get a pain medicine called in for the patient due to a possible kidney stone.  This is a re-occuring problem for the patient.  She states her husband (the patient) is unable to go another day without pain relief.  I went ahead and scheduled him for the 6pm slot with Dr.Duncan, however, the patient's wife is hoping to avoid coming in for an office visit (due to his discomfort)  Please advise if the patient needs this ov, or if something can be called in.  Thanks so much!   Pt's wife's number - [phone]   Pt's number - [phone]   (This message is being sent to Dr.Duncan and Dr.Gutierrez due to both providers treating the pt for kidney stones previously)   Thanks!

## 2013-07-13 NOTE — Patient Instructions (Signed)
Strain you urine, drop off the stone when you pass it.  Use the hydrocodone for pain and take the flomax in the meantime.  Take care.  We'll contact you with your xray report.

## 2013-07-13 NOTE — Progress Notes (Signed)
Pre-visit discussion using our clinic review tool. No additional management support is needed unless otherwise documented below in the visit note.  Pain over the last few weeks, then worse in the last week, then much more last night.  R lower abd pain, lower abd, near the inguinal canal.  Prev lithotripsy noted.  He has been drinking plenty of fluids and on a stone diet.    No L sided pain.  No blood seen in urine. No FCNAVD.  No pain meds other than naprosyn.  Pain comes and goes.    Meds, vitals, and allergies reviewed.   ROS: See HPI.  Otherwise, noncontributory.  nad ncat Mmm rrr ctab abd soft, not ttp No cva pain Testes bilaterally descended without nodularity, tenderness or masses. No scrotal masses or lesions. No penis lesions or urethral discharge.

## 2013-07-13 NOTE — Telephone Encounter (Signed)
Please see Call a nurse note. Disposition said ER now or PCP within 4 hours. OV will be greater than that window and call a nurse wanted to make you aware of that.

## 2013-07-14 NOTE — Assessment & Plan Note (Signed)
Renal stones noted on KUB.  Restart flomax, use hydrocone for pain and strain urine.  Would try to get stone analysis since these appear to be new.  He agrees.  Okay for outpatient f/u.  Should be able to pass these stones.

## 2013-08-04 ENCOUNTER — Ambulatory Visit (INDEPENDENT_AMBULATORY_CARE_PROVIDER_SITE_OTHER): Payer: BC Managed Care – PPO | Admitting: Family Medicine

## 2013-08-04 ENCOUNTER — Encounter: Payer: Self-pay | Admitting: Family Medicine

## 2013-08-04 ENCOUNTER — Telehealth: Payer: Self-pay | Admitting: Family Medicine

## 2013-08-04 ENCOUNTER — Ambulatory Visit (INDEPENDENT_AMBULATORY_CARE_PROVIDER_SITE_OTHER)
Admission: RE | Admit: 2013-08-04 | Discharge: 2013-08-04 | Disposition: A | Discharge status: Home / Self Care | Payer: BC Managed Care – PPO | Source: Ambulatory Visit | Attending: Family Medicine | Admitting: Family Medicine

## 2013-08-04 VITALS — BP 110/74 | HR 86 | Temp 97.9°F | Wt 168.5 lb

## 2013-08-04 DIAGNOSIS — R300 Dysuria: Secondary | ICD-10-CM

## 2013-08-04 DIAGNOSIS — R109 Unspecified abdominal pain: Secondary | ICD-10-CM

## 2013-08-04 DIAGNOSIS — N200 Calculus of kidney: Secondary | ICD-10-CM

## 2013-08-04 LAB — POCT URINALYSIS DIPSTICK
Glucose, UA: NEGATIVE
Ketones, UA: NEGATIVE
Spec Grav, UA: <=1.005
Urobilinogen, UA: 0.2

## 2013-08-04 MED ORDER — HYDROCODONE-ACETAMINOPHEN 5-325 MG PO TABS: 1.0000 | ORAL_TABLET | Freq: Four times a day (QID) | ORAL | Status: DC | PRN

## 2013-08-04 NOTE — Telephone Encounter (Signed)
Patient notified

## 2013-08-04 NOTE — Patient Instructions (Signed)
Roger Reed will call about your referral. Restart the pain medicine and keep straining your urine.  Drink plenty of fluids in the meantime.  Take care.

## 2013-08-04 NOTE — Telephone Encounter (Signed)
Per our earlier discussion, pt is calling in w/kidney stones and says he's out of pain meds.  He says when he was in on 07/13/2013 he was called and told he had 2 small stones, but he has not passed them.  He says he has side/back pain and also has had some burning w/urination and also difficulty urinating.  I am going to lunch and will not be back until 2:00 so if you need an apptmt scheduled or need someone to contact pt prior to 2:00 then please respond to [facility].  Thank you.

## 2013-08-04 NOTE — Telephone Encounter (Signed)
Please get him in at 2 PM.  Go ahead with the xray and u/a when he gets here and then put in him in a room.  Thanks.

## 2013-08-04 NOTE — Assessment & Plan Note (Signed)
Again noted on KUB.  Continue flomax.  Restart hydrocodone. Refer back to uro.  Pt agrees.

## 2013-08-04 NOTE — Progress Notes (Signed)
Pre-visit discussion using our clinic review tool. No additional management support is needed unless otherwise documented below in the visit note.  Still on flomax, out of pain meds. Inc in RLQ pain and lower back pain today.  No ADE from meds.  No stone passed yet.  Not burning with urination recently/today, some difficulty urinating last week.  Still straining urine.  He saw Dr. Brunilda Payor this summer.   Meds, vitals, and allergies reviewed.   ROS: See HPI.  Otherwise, noncontributory.  nad but uncomfortable Mmm rrr ctab abd soft RLQ pain at baseline but not ttp o/w Normal BS No CVA pain

## 2013-08-30 ENCOUNTER — Other Ambulatory Visit: Payer: Self-pay | Admitting: Family Medicine

## 2013-08-30 NOTE — Telephone Encounter (Signed)
Electronic refill request. Patient was only given #20.  Is he to stay on this medication?  Please advise.

## 2013-09-01 NOTE — Telephone Encounter (Signed)
I sent another 30.  He was to take it for the stone and I thought he was to have uro f/u. Please get update on patient.  Thanks.

## 2013-09-05 NOTE — Telephone Encounter (Signed)
Patient says he thinks he has passed the stone.  He went to [place] for the holidays and didn't have his strainer but he thinks he passed the stone.

## 2013-09-05 NOTE — Telephone Encounter (Signed)
Noted, thanks!

## 2013-12-15 ENCOUNTER — Ambulatory Visit: Payer: BC Managed Care – PPO | Admitting: Family Medicine

## 2013-12-16 ENCOUNTER — Encounter: Payer: Self-pay | Admitting: Family Medicine

## 2013-12-16 ENCOUNTER — Ambulatory Visit (INDEPENDENT_AMBULATORY_CARE_PROVIDER_SITE_OTHER): Payer: BC Managed Care – PPO | Admitting: Family Medicine

## 2013-12-16 VITALS — BP 122/78 | HR 85 | Temp 98.1°F | Wt 173.8 lb

## 2013-12-16 DIAGNOSIS — M549 Dorsalgia, unspecified: Secondary | ICD-10-CM

## 2013-12-16 MED ORDER — HYDROCODONE-ACETAMINOPHEN 5-325 MG PO TABS: 1.0000 | ORAL_TABLET | Freq: Four times a day (QID) | ORAL | Status: DC | PRN

## 2013-12-16 NOTE — Patient Instructions (Signed)
Stretch your hamstrings and [facility]imer[facility]ion will call about your referral. Take the vicodin as needed.  Take ibuprofen with food.  Take care.

## 2013-12-16 NOTE — Progress Notes (Signed)
Pre visit review using our clinic review tool, if applicable. No additional management support is needed unless otherwise documented below in the visit note.  If he is working a lot, overexerting himself, he'll get a catch in his lower back. It will episodically flare and then he'll have to stretch out a lot.  Lower midline pain.  No leg pain.  No bowel or bladder sx.  "It's in my tailbone."  Touching his toes helps the most with the pain.   Meds, vitals, and allergies reviewed.   ROS: See HPI.  Otherwise, noncontributory.  nad ncat rrr ctab abd soft, not ttp Pain on SI testing.  Hamstrings tight.  ttp at sacrum but midline not ttp o/w Distally nv intact

## 2013-12-18 NOTE — Assessment & Plan Note (Signed)
Okay to restart vicodin for now, needs to stretch daily and refer for PT.  Offered MRI- d/w pt ZO:[id number]:prev ortho notes, but MRI wouldn't likely change mgmt at this point.  He agrees. Will update me as needed.

## 2014-02-15 ENCOUNTER — Ambulatory Visit (INDEPENDENT_AMBULATORY_CARE_PROVIDER_SITE_OTHER): Payer: BC Managed Care – PPO | Admitting: Family Medicine

## 2014-02-15 ENCOUNTER — Encounter: Payer: Self-pay | Admitting: Family Medicine

## 2014-02-15 VITALS — BP 100/64 | HR 82 | Temp 98.1°F | Wt 177.5 lb

## 2014-02-15 DIAGNOSIS — M549 Dorsalgia, unspecified: Secondary | ICD-10-CM

## 2014-02-15 MED ORDER — HYDROCODONE-ACETAMINOPHEN 5-325 MG PO TABS: 1.0000 | ORAL_TABLET | Freq: Four times a day (QID) | ORAL | Status: DC | PRN

## 2014-02-15 NOTE — Patient Instructions (Addendum)
Use the hydrocodone (sedation caution) for now and start back on the naproxen (take it with food). Try to get back on the stretching routine in the meantime.  Glad to see you.

## 2014-02-15 NOTE — Assessment & Plan Note (Signed)
Continue stretching and strengthening per PT instructions and use naproxen/hydrocodone in meantime with routine cautions.  He agrees. He was making some good progress until this recent flare.  Call back prn.

## 2014-02-15 NOTE — Progress Notes (Signed)
Pre visit review using our clinic review tool, if applicable. No additional management support is needed unless otherwise documented below in the visit note.  He went to PT, was getting better, was stretching at home, then back pain flared again.  Prolonged riding likely aggravated his back.  [place] No radicular pain.  No FCNAVD.  No B/B sx.  Center of lower back is sore. No rash.  No trauma.  Pain now is about the same as the last flare but worse "when it grabs and gives me a jolt."  Stretching does help, if he can get through the stretches.    Meds, vitals, and allergies reviewed.   ROS: See HPI.  Otherwise, noncontributory.  nad rrr ctab abd soft Lower midline ttp w/o rash Distally nv intact in the BLE

## 2014-03-16 ENCOUNTER — Ambulatory Visit (INDEPENDENT_AMBULATORY_CARE_PROVIDER_SITE_OTHER)
Admission: RE | Admit: 2014-03-16 | Discharge: 2014-03-16 | Disposition: A | Discharge status: Home / Self Care | Payer: BC Managed Care – PPO | Source: Ambulatory Visit | Attending: Family Medicine | Admitting: Family Medicine

## 2014-03-16 ENCOUNTER — Encounter: Payer: Self-pay | Admitting: Family Medicine

## 2014-03-16 ENCOUNTER — Ambulatory Visit (INDEPENDENT_AMBULATORY_CARE_PROVIDER_SITE_OTHER): Payer: BC Managed Care – PPO | Admitting: Family Medicine

## 2014-03-16 VITALS — BP 120/78 | HR 78 | Temp 98.1°F | Wt 175.2 lb

## 2014-03-16 DIAGNOSIS — M545 Low back pain, unspecified: Secondary | ICD-10-CM

## 2014-03-16 MED ORDER — HYDROCODONE-ACETAMINOPHEN 5-325 MG PO TABS: 1.0000 | ORAL_TABLET | Freq: Four times a day (QID) | ORAL | Status: DC | PRN

## 2014-03-16 MED ORDER — NAPROXEN 500 MG PO TABS: 500.0000 mg | ORAL_TABLET | Freq: Two times a day (BID) | ORAL | Status: DC | PRN

## 2014-03-16 NOTE — Patient Instructions (Signed)
Go to the lab on the way out.  We'll contact you with your xray report. Roger Reed will call about your referral. Take care.  Glad to see you.  

## 2014-03-16 NOTE — Assessment & Plan Note (Signed)
Restart naproxen and hydrocodone prn with nsaid/opiate cautions. See notes on imaging, reviewed films.  Refer to ortho at this point.  He's either going to have a lesion that can be identified by plain films or will likely need other imaging, ie MRI.  He agrees.

## 2014-03-16 NOTE — Progress Notes (Signed)
Pre visit review using our clinic review tool, if applicable. No additional management support is needed unless otherwise documented below in the visit note.  Back pain.  He has moved from doing well with occ flares, now to constant pain recently.  Had been working on stretching and lower back exercises prev.    Last week week he was leaning over and then felt like "ice cold water was dripping on my lower back."  Then he felt it again later on, in the midline.  Some pain in his leg with SLR prev  He has more pain radiating up his back.  He went through PT prev, to get coached up on exercises.  No B/B sx.  No fevers.    Meds, vitals, and allergies reviewed.   ROS: See HPI.  Otherwise, noncontributory.  nad ncat Mmm rrr ctab Abd soft, not ttp Lower midline back ttp SLR neg  Distally nv intact with normal BLE S/S/DTRs

## 2014-04-17 ENCOUNTER — Telehealth: Payer: Self-pay

## 2014-04-17 ENCOUNTER — Ambulatory Visit: Payer: Self-pay | Admitting: Orthopedic Surgery

## 2014-04-17 MED ORDER — DIAZEPAM 5 MG PO TABS: 5.0000 mg | ORAL_TABLET | Freq: Once | ORAL | Status: DC

## 2014-04-17 NOTE — Telephone Encounter (Signed)
Pt left v/m; pt is scheduled to have MRI today at 2:30 pm; pt request med to help with claustrophobia prior to having [facility] today[facility] request cb when med sent to pharmacy.

## 2014-04-17 NOTE — Telephone Encounter (Signed)
Pt called to ck on status of med; [facility]. Pt voiced understanding.

## 2014-04-17 NOTE — Telephone Encounter (Signed)
Medication phoned to pharmacy. Left detailed message on voicemail of patient. 

## 2014-04-17 NOTE — Telephone Encounter (Signed)
Please call in.  Thanks.   

## 2014-04-21 ENCOUNTER — Telehealth: Payer: Self-pay

## 2014-04-21 ENCOUNTER — Telehealth: Payer: Self-pay | Admitting: Family Medicine

## 2014-04-21 NOTE — Telephone Encounter (Signed)
Pt says Dr. Rosita KeaMenz called him w/the results of his MRI. He reccommended pt to have a steroid epidural and mentioned he needs to take it easy for possibility of rupturing a disc. Pt would like to talk w/you before making any decisions. Can you please call him? Thank you.

## 2014-04-21 NOTE — Telephone Encounter (Signed)
Pt request MRI of back results; [facility]14 at 3 PM. Pt said Dr Para Marchuncan ordered MRI; I cannot find MRI order or report. [facility] Leon[facility] and [facility] ordering physician for MRI. Spoke with pt and he forgot Dr Rosita KeaMenz ordered MRI and pt will contact Dr Neomia GlassMenz's office for results of MRI. Charles at med records said if Dr Para Marchuncan would like copy of MRI report can [facility].Please advise.

## 2014-04-23 NOTE — Telephone Encounter (Signed)
[facility] should be able to send this w/o a signed request from the patient.  Please request a copy.  Thanks.

## 2014-04-23 NOTE — Telephone Encounter (Signed)
Late entry.  Called pt Friday PM.  D/w him about his case.  I don't think they would offer the injection w/o thinking it was a good idea, with an appropriate risk/benefit ratio.  It may help his pain.  Discussed.  He'll call back about f/u with them.  He thanked me for the call.

## 2014-04-24 NOTE — Telephone Encounter (Signed)
Placed in your In Box.

## 2014-04-24 NOTE — Telephone Encounter (Signed)
Requested.

## 2015-01-02 ENCOUNTER — Ambulatory Visit (INDEPENDENT_AMBULATORY_CARE_PROVIDER_SITE_OTHER): Payer: BC Managed Care – PPO | Admitting: Primary Care

## 2015-01-02 ENCOUNTER — Encounter: Payer: Self-pay | Admitting: Primary Care

## 2015-01-02 VITALS — BP 120/70 | HR 89 | Temp 98.2°F | Ht 69.0 in | Wt 162.8 lb

## 2015-01-02 DIAGNOSIS — W57XXXA Bitten or stung by nonvenomous insect and other nonvenomous arthropods, initial encounter: Secondary | ICD-10-CM

## 2015-01-02 DIAGNOSIS — T148 Other injury of unspecified body region: Secondary | ICD-10-CM

## 2015-01-02 MED ORDER — DOXYCYCLINE HYCLATE 100 MG PO TABS: 200.0000 mg | ORAL_TABLET | Freq: Once | ORAL | Status: DC

## 2015-01-02 NOTE — Patient Instructions (Signed)
Be sure to watch out for signs/symptoms of Lyme's Disease or [facility] Spotted Fever as discussed. Take both Doxycycline tablets by mouth once for prevention. You may take tylenol for chills/fevers. [place] safe trip to [place]  Lyme Disease You may have been bitten by a tick and are to watch for the development of Lyme Disease. Lyme Disease is an infection that is caused by a bacteria The bacteria causing this disease is named Borreilia burgdorferi. If a tick is infected with this bacteria and then bites you, then Lyme Disease may occur. These ticks are carried by deer and rodents such as rabbits and mice and infest grassy as well as forested areas. Fortunately most tick bites do not cause Lyme Disease.  Lyme Disease is easier to prevent than to treat. First, covering your legs with clothing when walking in areas where ticks are possibly abundant will prevent their attachment because ticks tend to stay within inches of the ground. Second, using insecticides containing DEET can be applied on skin or clothing. Last, because it takes about 12 to 24 hours for the tick to transmit the disease after attachment to the human host, you should inspect your body for ticks twice a day when you are in areas where Lyme Disease is common. You must look thoroughly when searching for ticks. The Ixodes tick that carries Lyme Disease is very small. It is around the size of a sesame seed (picture of tick is not actual size). Removal is best done by grasping the tick by the head and pulling it out. Do not to squeeze the body of the tick. This could inject the infecting bacteria into the bite site. Wash the area of the bite with an antiseptic solution after removal.  Lyme Disease is a disease that may affect many body systems. Because of the small size of the biting tick, most people do not notice being bitten. The first sign of an infection is usually a round red rash that extends out from the center of the tick bite.  The center of the lesion may be blood colored (hemorrhagic) or have tiny blisters (vesicular). Most lesions have bright red outer borders and partial central clearing. This rash may extend out many inches in diameter, and multiple lesions may be present. Other symptoms such as fatigue, headaches, chills and fever, general achiness and swelling of lymph glands may also occur. If this first stage of the disease is left untreated, these symptoms may gradually resolve by themselves, or progressive symptoms may occur because of spread of infection to other areas of the body.  Follow up with your caregiver to have testing and treatment if you have a tick bite and you develop any of the above complaints. Your caregiver may recommend preventative (prophylactic) medications which kill bacteria (antibiotics). Once a diagnosis of Lyme Disease is made, antibiotic treatment is highly likely to cure the disease. Effective treatment of late stage Lyme Disease may require longer courses of antibiotic therapy.  MAKE SURE YOU:   Understand these instructions.  Will watch your condition.  Will get help right away if you are not doing well or get worse. Document Released: 11/24/2000 Document Revised: 11/10/2011 Document Reviewed: 01/26/2009 [facility] Patient Information 2015 [employer] [place]. This information is not intended to replace advice given to you by your health care provider. Make sure you discuss any questions you have with your health care provider.

## 2015-01-02 NOTE — Progress Notes (Signed)
Pre visit review using our clinic review tool, if applicable. No additional management support is needed unless otherwise documented below in the visit note. 

## 2015-01-02 NOTE — Progress Notes (Signed)
Subjective:    Patient ID: Roger Reed, male    DOB: 08/17/1976, 39 y.o.   MRN: [id number]  HPI  Roger Reed is a 39 year old male who presents today with a chief complaint of headache, weakness, and chills that has been present since last Friday after pulling off a tick that was latched to his left axilla. The tick was very small in size and didn't appear to have been latched on long. Friday afternoon he began to feel tired and had developed a headache and chills. He continued to feel poorly throughout the weekend but began feeling worse Monday afternoon as he slept for most of the day. He found another tick latched to the back of his left thigh last night, which was small as well. He's feeling better today. He denies rashes, itching, muscle/joint aches, fever.  Denies taking iburpofen or tylenol. He will be traveling to [place] this weekend and would like treatment to help prevent Lyme's Disease.   Review of Systems  Constitutional: Positive for chills and fatigue. Negative for fever.  Respiratory: Negative for shortness of breath.   Cardiovascular: Negative for chest pain.  Gastrointestinal: Negative for nausea and vomiting.  Musculoskeletal: Negative for myalgias and arthralgias.  Skin: Negative for rash.  Neurological: Positive for headaches. Negative for dizziness and numbness.       Past Medical History  Diagnosis Date  . GERD (gastroesophageal reflux disease)   . Kidney stones     2014- [facility] ([place])  . History of chicken pox   . Irregular heart rate     2012  . Dysrhythmia     History   Social History  . Marital Status: Married    Spouse Name: N/A  . Number of Children: N/A  . Years of Education: N/A   Occupational History  . Machinist / Musician    Social History Main Topics  . Smoking status: Never Smoker   . Smokeless tobacco: Never Used  . Alcohol Use: Yes     Comment: occ  . Drug Use: No  . Sexual Activity: Not on file   Other Topics  Concern  . Not on file   Social History Narrative   Some college.   Married 2009   1 daughter, Secondary school teacher, Psychologist, clinical    Past Surgical History  Procedure Laterality Date  . Tonsillectomy and adenoidectomy  1984    Family History  Problem Relation Age of Onset  . Alcohol abuse Other   . Drug abuse Other   . Diabetes Other   . Arthritis Other   . Stroke Other   . Diabetes Other   . Diabetes Other   . Cancer Other     Brain    No Known Allergies  No current outpatient prescriptions on file prior to visit.   No current facility-administered medications on file prior to visit.    BP 120/70 mmHg  Pulse 89  Temp(Src) 98.2 F (36.8 C) (Oral)  Ht  (1.753 m)  Wt 162 lb 12.8 oz (73.846 kg)  BMI 24.03 kg/m2  SpO2 98%    Objective:   Physical Exam  Constitutional: He is oriented to person, place, and time. He appears well-developed.  Neck: Neck supple.  Cardiovascular: Normal rate and regular rhythm.   Pulmonary/Chest: Effort normal and breath sounds normal.  Musculoskeletal: Normal range of motion.  Lymphadenopathy:    He has no cervical adenopathy.  Neurological: He is alert and oriented to person,  place, and time.  Skin: Skin is warm and dry.  1/2 cm red macule present under left axilla and posterior left thigh. No erythema migrans or other rashes noted around areas.          Assessment & Plan:  Tick Bites:  Present under left axilla and left posterior thigh.  No erythem migrans. Reports fatigue, chills, headaches. Is requesting to be treated for prevention of Lyme's due to upcoming vacation. RX for Doxycycline 200 mg once provided. Education provided to patient on s/s of Lyme's and to be observing his skin for those symptoms. Follow up as needed.

## 2015-10-19 ENCOUNTER — Ambulatory Visit (INDEPENDENT_AMBULATORY_CARE_PROVIDER_SITE_OTHER): Payer: BC Managed Care – PPO | Admitting: Family Medicine

## 2015-10-19 ENCOUNTER — Encounter: Payer: Self-pay | Admitting: Family Medicine

## 2015-10-19 VITALS — BP 112/70 | HR 87 | Temp 98.2°F | Wt 172.5 lb

## 2015-10-19 DIAGNOSIS — M7711 Lateral epicondylitis, right elbow: Secondary | ICD-10-CM | POA: Diagnosis not present

## 2015-10-19 DIAGNOSIS — R0989 Other specified symptoms and signs involving the circulatory and respiratory systems: Secondary | ICD-10-CM | POA: Diagnosis not present

## 2015-10-19 NOTE — Progress Notes (Signed)
Pre visit review using our clinic review tool, if applicable. No additional management support is needed unless otherwise documented below in the visit note.  R elbow.  Started bothering him about 2 months ago.  Lots of lifting/manipulation at work.  R handed.  It flared up, then got some better with rest.  No L sided sx.  Took advil, nothing else.   Chest sx.  He didn't know if he may have had a panic attack.  He has a new baby on the way, due 04/2016.  He had some chills and chest pain at night, a few nights ago.  It would last about 1-2 hours.  Then would be fine for a few days, then would have sx again.    This AM had some pain on the R side of the sternum, while brushing his teeth.  Pain with a deep breath.  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  No fevers known.  This was clearly different than the prev nocturnal sx.  No exertional sx.  Some chest discomfort/sx with deep breath.   No wheeze.  No cough.  No h/o DVT.  No FH DVT.  No calf swelling.    Meds, vitals, and allergies reviewed.   ROS: See HPI.  Otherwise, noncontributory.  nad ncat Neck with normal ROM Normal ROM shoulders but ttp at R lateral epicondyle.  + testing for tennis elbow but medially and posteriorly not ttp at the elbow.   Normal ROM R wrist.  No rash no bruising.  Chest wall normal inspection. No rash.  rrr ctab With application of pressure on the sternum during a deep breath, he'll have some pain along the L lower anterior ribs.   abd soft, not ttp

## 2015-10-19 NOTE — Patient Instructions (Signed)
Ibuprofen/advil with food.  Up to  3 times a day (  tabs, 3 tabs at a time).  That may help your chest wall pain.  Ice you elbow and use a tennis elbow strap . Use the exercises and update me as needed.  Take care.  Glad to see you.

## 2015-10-21 DIAGNOSIS — R0989 Other specified symptoms and signs involving the circulatory and respiratory systems: Secondary | ICD-10-CM | POA: Insufficient documentation

## 2015-10-21 DIAGNOSIS — M7710 Lateral epicondylitis, unspecified elbow: Secondary | ICD-10-CM | POA: Insufficient documentation

## 2015-10-21 NOTE — Assessment & Plan Note (Signed)
D/w pt about dx and tx.  Handout given with recs for nsaids with GI caution, ROM exercises, elbow strap, etc.  Update me if not better.  He agrees.  Relative rest in meantime.

## 2015-10-21 NOTE — Assessment & Plan Note (Signed)
The nocturnal sx could have been from anxiety, he'll update me is that continues.  The current sx are likely in the chest wall and a benign source.  He doesn't have calf tenderness or other red flag sx.  He has changes with putting pressure on the sternum during a deep breath.  He does a lot of manual work and he could have pulled a muscle/chostrochondritis.  D/w pt.  Update me as need. Ibuprofen may help the chest wall pain.

## 2015-11-28 ENCOUNTER — Ambulatory Visit (INDEPENDENT_AMBULATORY_CARE_PROVIDER_SITE_OTHER): Payer: BC Managed Care – PPO | Admitting: Family Medicine

## 2015-11-28 ENCOUNTER — Encounter: Payer: Self-pay | Admitting: Family Medicine

## 2015-11-28 VITALS — BP 104/66 | HR 102 | Temp 98.1°F | Wt 173.8 lb

## 2015-11-28 DIAGNOSIS — R42 Dizziness and giddiness: Secondary | ICD-10-CM

## 2015-11-28 NOTE — Patient Instructions (Signed)
Marion will call about your referral. Take care.  Glad to see you.  

## 2015-11-28 NOTE — Progress Notes (Signed)
Pre visit review using our clinic review tool, if applicable. No additional management support is needed unless otherwise documented below in the visit note.  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  Stopping that stopped the chest sx.   Vertigo.  Had started years ago.  Room spin with movement, unsteady and nausea.  Worse with head movement. Will happen occ, every few months, but worse recently. Took dramamine with relief but it made him sleepy.  He has done epley exercises prev with variable results.    Incidental hx about his daughter: His daughter had gillian barre sx last year.  Father noted some return of possible sx today in her and has already contacted peds clinic.  I advised him to monitor closely and contact peds clinic if any other changes in the meantime.  She is going in for f/u soon.    Meds, vitals, and allergies reviewed.   ROS: See HPI.  Otherwise, noncontributory.  GEN: nad, alert and oriented HEENT: mucous membranes moist, tm wnl, nasal exam wnl,  NECK: supple w/o LA CV: rrr.  no murmur PULM: ctab, no inc wob CN 2-12 wnl B, S/S/DTR wnl x4 DHP neg

## 2015-11-29 NOTE — Assessment & Plan Note (Signed)
Recurrent w/o tinnitus or focal neuro changes, some better with otc meds, but I can't induce sx today on exam.   Given the recurrent nature over the years and worse recently, I would like ENT input just to make sure this is only BPV.  He agrees. Refer.

## 2016-05-01 IMAGING — CR DG LUMBAR SPINE COMPLETE 4+V
5 series · 5 of 5 positions shown · non-contrast
Comparison: CT 08/08/2013 .

CLINICAL DATA: Pain.

EXAM:
LUMBAR SPINE - COMPLETE 4+ VIEW

[view not recorded (1 of 5)]
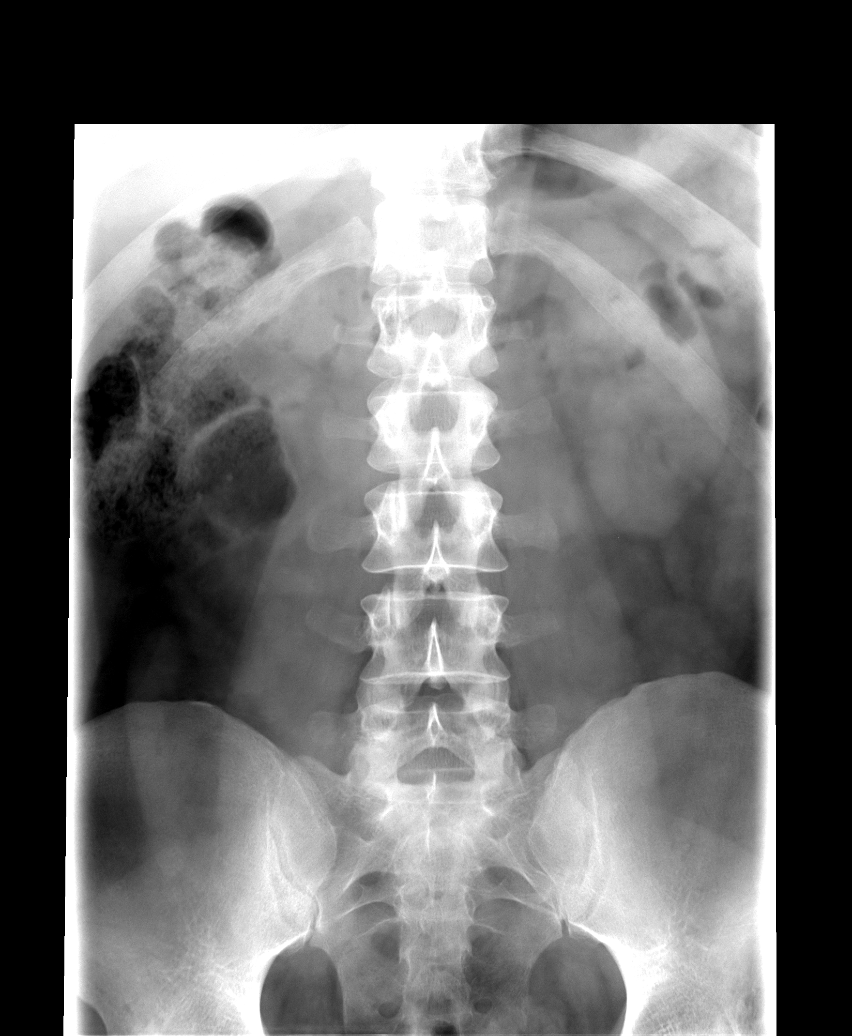

[view not recorded (2 of 5)]
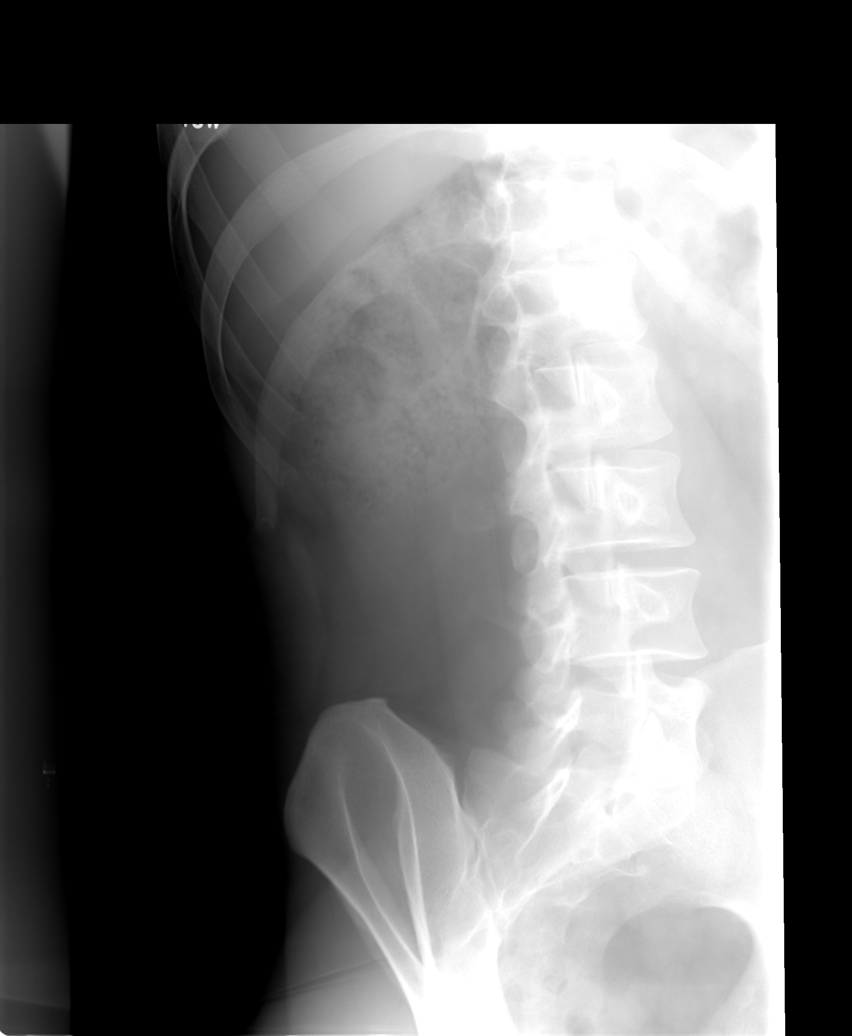

[view not recorded (3 of 5)]
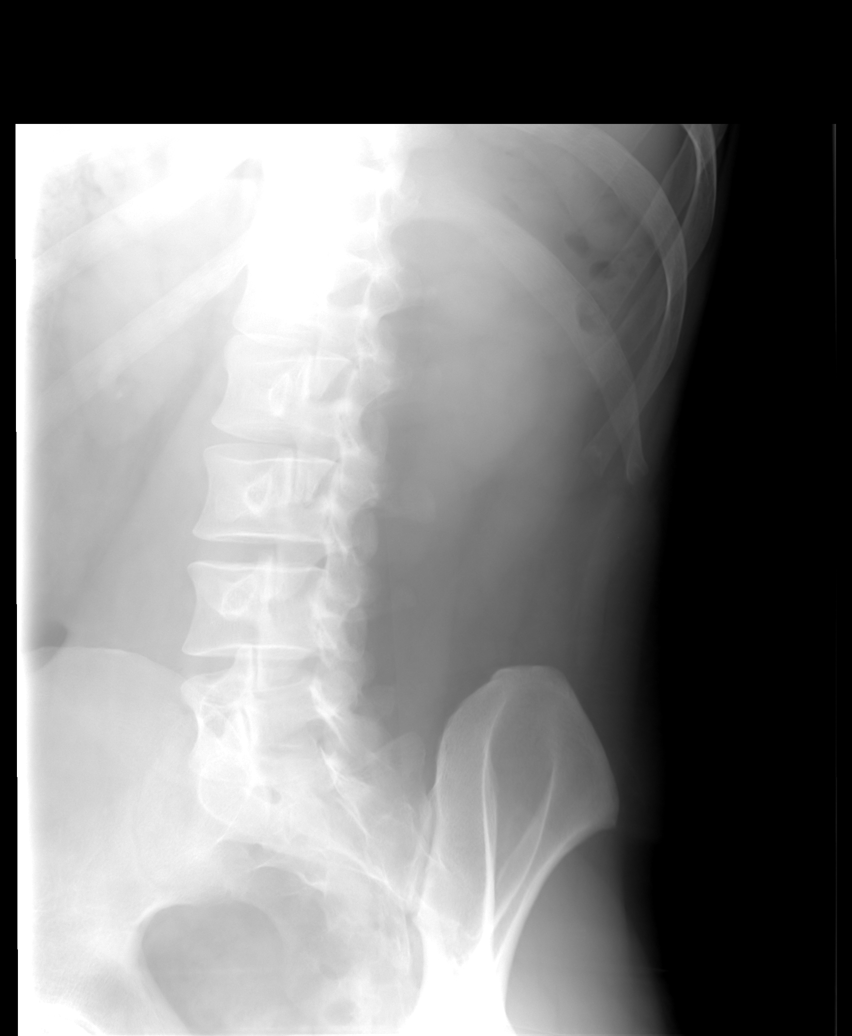

[view not recorded (4 of 5)]
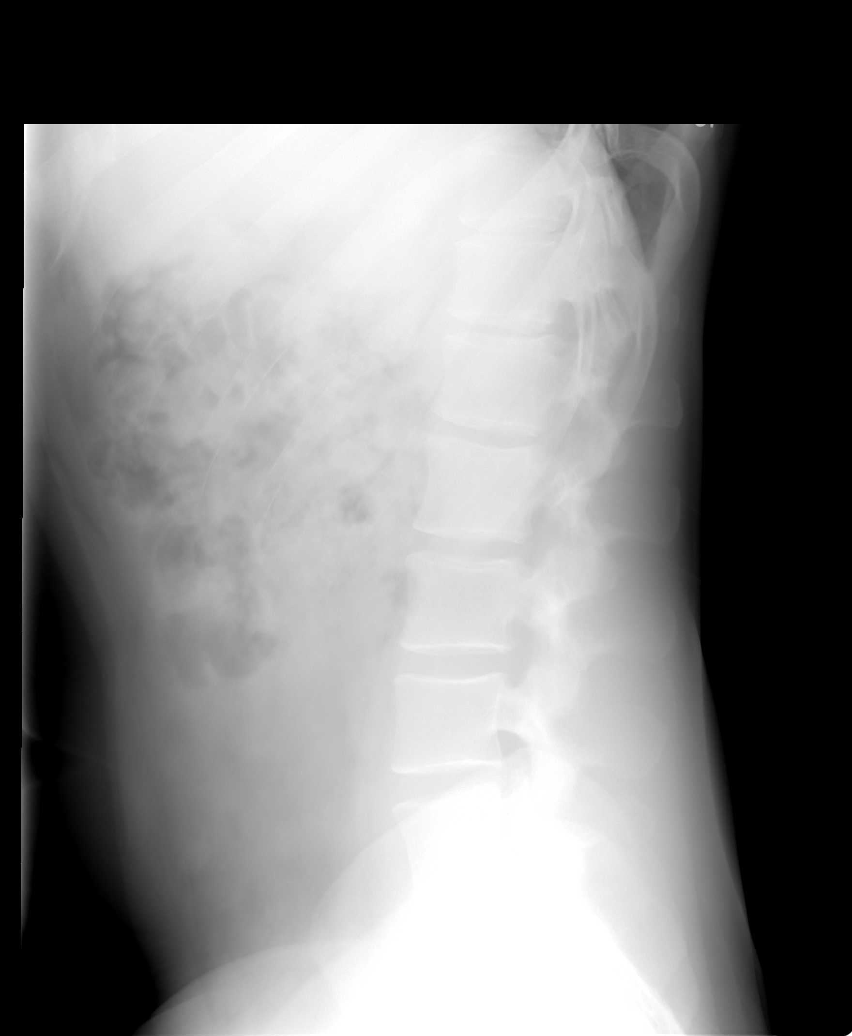

[view not recorded (5 of 5)]
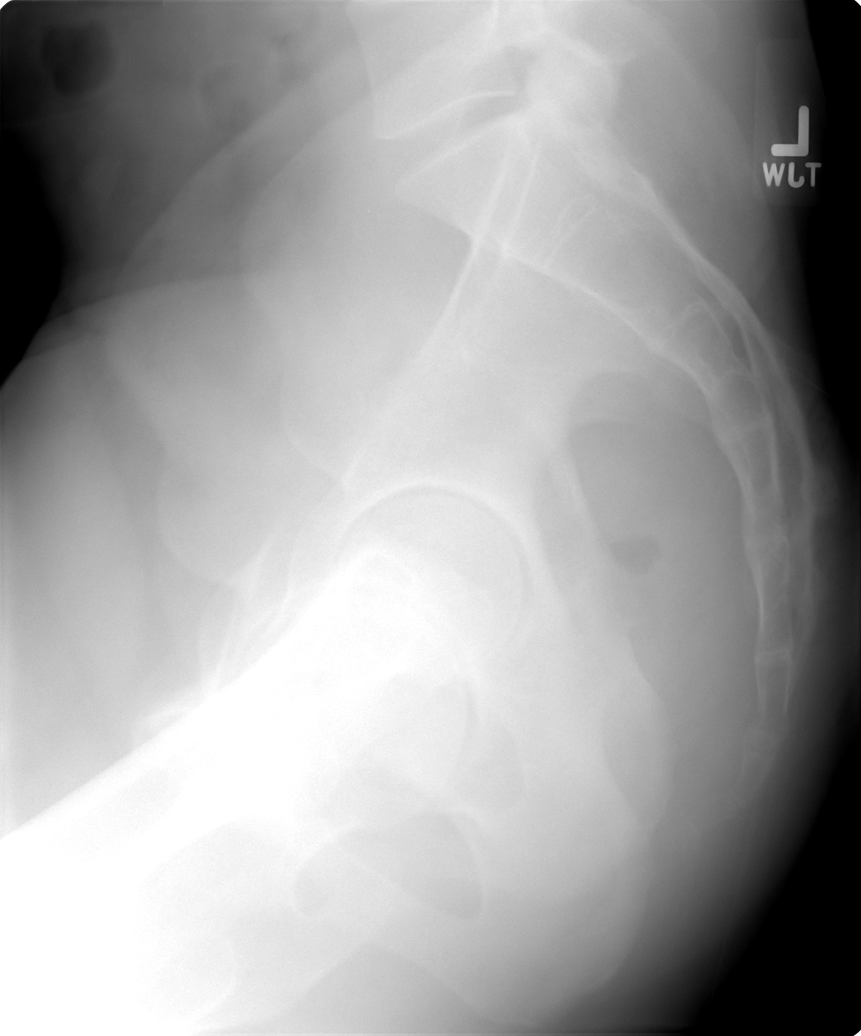

[5 of 5 positions shown; findings below may reference images not displayed]

FINDINGS: Paraspinal soft tissues are normal. No acute bony abnormality
identified. Normal bony alignment and mineralization. Punctate
calcification of the right kidney suggesting right nephrolithiasis.
IMPRESSION: 1.  No acute bony abnormality.

2. Punctate calcification over right kidney suggesting right
nephrolithiasis.

## 2016-12-31 ENCOUNTER — Encounter: Payer: Self-pay | Admitting: Family Medicine

## 2016-12-31 ENCOUNTER — Ambulatory Visit (INDEPENDENT_AMBULATORY_CARE_PROVIDER_SITE_OTHER): Payer: BC Managed Care – PPO | Admitting: Family Medicine

## 2016-12-31 DIAGNOSIS — M79606 Pain in leg, unspecified: Secondary | ICD-10-CM | POA: Insufficient documentation

## 2016-12-31 DIAGNOSIS — M79604 Pain in right leg: Secondary | ICD-10-CM | POA: Diagnosis not present

## 2016-12-31 DIAGNOSIS — R0789 Other chest pain: Secondary | ICD-10-CM | POA: Insufficient documentation

## 2016-12-31 MED ORDER — RANITIDINE HCL 150 MG PO TABS: 150.0000 mg | ORAL_TABLET | Freq: Two times a day (BID) | ORAL | Status: DC

## 2016-12-31 NOTE — Assessment & Plan Note (Signed)
Discussed with patient. Likely 2 separate issues. Likely had a muscle spasm in the adductor muscles of the right thigh while driving back. This apparently resolved. The local bruising in the shin has resolved. He has minimal residual swelling at the previous injury site. Able to bear weight. No reason to suspect fracture and no imaging needed at this point. Patient reassured. No signs of DVT.

## 2016-12-31 NOTE — Progress Notes (Signed)
Still with episodic vertigo.  He has had ENT eval in the meantime.  When he has an episode, he'll try to f/u with ENT so they can see him in an episode.  Using dramamine prn in the meantime.   He was helping his father with a trailer.  He was torquing a bolt but the ratchet slipped off, hit his R shin.  That was about 2-3 weeks ago.  Had bruising and swelling locally, anteriorly.  Then it moved distally gradually, as expected.  He was still able to bear weight.  Gradually better in the meantime.  No pain now unless he hits the contacted site on the upper shin.   He an episode of chest pain and R medial thigh pain driving yesterday, coming back from [place].  He had prev attributed some episodic CP to grinding/tool work but he hasn't done that recently.  It came on at rest.  It only happens at rest, not with exertion.  They broke up the trip, drive a few hours per day. Some occ heartburn in the past but that didn't feel like prev sx noted on the trip.  No thigh pain now.    Meds, vitals, and allergies reviewed.   ROS: Per HPI unless specifically indicated in ROS section   GEN: nad, alert and oriented HEENT: mucous membranes moist NECK: supple w/o LA CV: rrr.  no murmur, left chest wall was initially tender with deep breath but this resolved with repeated deep breathing.  PULM: ctab, no inc wob ABD: soft, +bs EXT: no edema SKIN: no acute rash Right side not tender to palpation. Right upper shin with a small amount of local swelling. Not bruised. Not tender over bony prominences otherwise. Bilateral calf circumference 34 cm, symmetric. Neither calf tender palpation. No palpable bands or cords in the calf.

## 2016-12-31 NOTE — Patient Instructions (Signed)
Try zantac twice a day and update me as needed.  Take care.  Glad to see you.

## 2016-12-31 NOTE — Assessment & Plan Note (Signed)
Atypical symptoms. Not with exertion. Only with rest. Unlikely to be ominous cause. Discussed with patient. More likely to be from GERD versus anxiety. Reasonable to try over-the-counter Zantac twice a day and update me if not improved. He agrees. Okay for outpatient follow-up. Since he is young and able to exert without symptoms and has clear lungs I see no reason to suspect a cardiopulmonary issue. D/w pt.

## 2017-04-20 ENCOUNTER — Telehealth: Payer: Self-pay | Admitting: Family Medicine

## 2017-04-20 MED ORDER — HYDROXYZINE HCL 10 MG PO TABS: 10.0000 mg | ORAL_TABLET | Freq: Three times a day (TID) | ORAL | 0 refills | Status: DC | PRN

## 2017-04-20 NOTE — Telephone Encounter (Signed)
Would try hydroxyzine prn sx with sedation caution and update [place] as he goes along.  Thanks.  rx sent.

## 2017-04-20 NOTE — Telephone Encounter (Signed)
Patient;s wife called ,left message regarding patient. She states that when patient was last seen for chest pain, he was told it could either be heartburn or anxiety and to try Zantac and callback if needed. She states that last night, he started having some of the same chest pain and took Zantac and it did'nt help, states patient tried drinking milk, as that has helped in past with heartburn, and that did not work. She states his hands and feet got cold and he started shivering and she has neve seen him do that before. So they are thinking it is anxiety. She asks what they need to do. She states it does not happen often, just once in a while. Patient uses [facility] [address] Juliann Pulse for wife [phone], callback for patient [phone]

## 2017-04-20 NOTE — Telephone Encounter (Signed)
Wife advised. 

## 2018-05-18 ENCOUNTER — Telehealth: Payer: Self-pay

## 2018-05-18 NOTE — Telephone Encounter (Signed)
I spoke with pts wife (DPR signed)On and off problems with anxiety and depression for awhile now; pt usually says it will pass but this time he told his wife he probably should be seen. No SI/HI.Pt is not on any medication. Pt is functioning normally. Pt is off work on Fri and wants appt for this Fri. Scheduled 30' appt 05/21/18 at 2:15 with Dr Para Marchuncan. Mrs Odessa FlemingSmithey understands if pt condition changes or worsens prior to appt or if pt talks of harming himself or anyone else to go to ED. Mrs Odessa FlemingSmithey voiced understanding. FYI to Dr Para Marchuncan.

## 2018-05-18 NOTE — Telephone Encounter (Signed)
Copied from CRM [phone]. Topic: Appointment Scheduling - Scheduling Inquiry for Clinic >> May 18, 2018  4:16 PM Angela NevinWilliams, Candice N wrote: Reason for CRM: Pts wife [place] called to make pt an appointment for depression/anxiety. [place] states that pt is off on Friday and would like to know if he can be seen this coming Friday 9/19. There are only same day slots left. Please advise.

## 2018-05-19 NOTE — Telephone Encounter (Signed)
Noted. Thanks. Will see at OV.  

## 2018-05-21 ENCOUNTER — Telehealth: Payer: Self-pay

## 2018-05-21 ENCOUNTER — Ambulatory Visit: Payer: Self-pay | Admitting: Family Medicine

## 2018-05-21 NOTE — Telephone Encounter (Signed)
See 05/18/18 phone note; Unable to reach pt to see if he is OK to wait until 05/27/18 for appt. Called the [phone] and recording x 2 number has changed or not in service. I tried calling [place] (DPR signed) and was unable to speak with her. FYI to Dr Para Marchuncan and Larita FifeLynn.

## 2018-05-21 NOTE — Telephone Encounter (Signed)
Man thanks.  I'll see him then.

## 2018-05-21 NOTE — Telephone Encounter (Signed)
Copied from CRM [phone]. Topic: Quick Communication - Appointment Cancellation >> May 21, 2018  2:03 PM Maia Pettiesrtiz, Kristie S wrote: Patient called to cancel appointment scheduled for today 2:15pm with Dr. Para Marchuncan. Patient has rescheduled their appointment.  Pt states he spoke to another agent 5 minutes prior.  Pt states he moved and the house is further away than he allowed for, GPS showed not getting to office until 2:35, pt rescheduled for 05/27/18 4:00pm  Route to department's [facility] pool.

## 2018-05-21 NOTE — Telephone Encounter (Signed)
Pt's wife [place] called and states that the pt cancelled his appt that was previously scheduled because he was running 15 min late and was told he would need to reschedule appt. Pt's wife states she asked the pt if he was ok to wait a week for a appt and pt stated he was ok to wait. Pt's wife states that the pt stated that he felt ok as long as he kept busy. Pt denies having any SI thoughts. Pt's new cell phone number is [phone] and has been updated in pt's chart.

## 2018-05-21 NOTE — Telephone Encounter (Signed)
Thank you for trying, please try to [facility] a [facility]. Thanks.

## 2018-05-21 NOTE — Telephone Encounter (Signed)
Left v/m with [place] to cb with update on pt condition. Created CRM [facility] to [facility] pt; see 05/18/18 phone note.

## 2018-05-24 NOTE — Telephone Encounter (Signed)
Appointment has been rescheduled and patient advised that Dr. Para Marchuncan will see him then and he verbalized understanding.

## 2018-05-25 NOTE — Telephone Encounter (Signed)
Candace with P[facility] has [place] pts wife on phone and pt has texted [place] and wants to see if could be seen before 05/27/18. 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 Dr Para Marchuncan said will see pt on 05/26/18 at 1:45. [place] advised and if pt has any SI/HI or cannot wait for appt on 05/26/18 pt should go to ED for eval. [facility]. FYI to Dr Para Marchuncan

## 2018-05-26 ENCOUNTER — Encounter: Payer: Self-pay | Admitting: Family Medicine

## 2018-05-26 ENCOUNTER — Ambulatory Visit (INDEPENDENT_AMBULATORY_CARE_PROVIDER_SITE_OTHER): Payer: PRIVATE HEALTH INSURANCE | Admitting: Family Medicine

## 2018-05-26 VITALS — BP 130/86 | HR 101 | Temp 98.1°F | Ht 69.0 in | Wt 193.0 lb

## 2018-05-26 DIAGNOSIS — R5383 Other fatigue: Secondary | ICD-10-CM | POA: Diagnosis not present

## 2018-05-26 NOTE — Progress Notes (Addendum)
Mood eval.  "It's something that has been going on for a while, comes and goes, but staying longer more recently."  Worse in the last ~month.  He is at a new job, since June.  He doesn't plan on quitting his job but isn't motivated.  "Roger Reed was a bad today, today is more in the middle of the road."  He has a lot of negative thoughts.    Has been going on with episodic dec in mood for years.  Wife has noted mood changes.    He has been more anxious at work recently.  His concentration is lower than prev.    No Si/Hi.  Contracts for safety.    He is still having some fun with his kids. 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    Not smoking.  Etoh: 4-5 total on the weekend.  No illicit use.   No history of mania.  His father had similar symptoms around this time in adulthood.  No FCNAVD.  He has some L plantar fasciitis with 1st steps in the AM.  Discussed with patient about routine stretching.  PMH and SH reviewed  ROS: Per HPI unless specifically indicated in ROS section   Meds, vitals, and allergies reviewed.   GEN: nad, alert and oriented, affect slightly flat.  Speech and judgment still intact. HEENT: mucous membranes moist NECK: supple w/o LA CV: rrr PULM: ctab, no inc wob ABD: soft, +bs EXT: no edema No tremor. SKIN: no acute rash

## 2018-05-26 NOTE — Patient Instructions (Signed)
Go to the lab on the way out.  We'll contact you with your lab report. If your labs are fine then we'll see about med options to help with your mood.   Take care.  Glad to see you.

## 2018-05-26 NOTE — Telephone Encounter (Signed)
Noted. Thanks.

## 2018-05-27 ENCOUNTER — Other Ambulatory Visit: Payer: Self-pay | Admitting: Family Medicine

## 2018-05-27 ENCOUNTER — Ambulatory Visit: Payer: Self-pay | Admitting: Family Medicine

## 2018-05-27 DIAGNOSIS — R5383 Other fatigue: Secondary | ICD-10-CM | POA: Insufficient documentation

## 2018-05-27 LAB — CBC WITH DIFFERENTIAL/PLATELET
BASOS PCT: 0.8 % (ref 0.0–3.0)
Basophils Absolute: 0.1 10*3/uL (ref 0.0–0.1)
EOS PCT: 1.3 % (ref 0.0–5.0)
Eosinophils Absolute: 0.1 10*3/uL (ref 0.0–0.7)
HEMATOCRIT: 44.7 % (ref 39.0–52.0)
Hemoglobin: 15.2 g/dL (ref 13.0–17.0)
LYMPHS PCT: 29 % (ref 12.0–46.0)
Lymphs Abs: 2.3 10*3/uL (ref 0.7–4.0)
MCHC: 34.1 g/dL (ref 30.0–36.0)
MCV: 89.3 fl (ref 78.0–100.0)
MONOS PCT: 7 % (ref 3.0–12.0)
Monocytes Absolute: 0.6 10*3/uL (ref 0.1–1.0)
NEUTROS ABS: 4.9 10*3/uL (ref 1.4–7.7)
Neutrophils Relative %: 61.9 % (ref 43.0–77.0)
PLATELETS: 169 10*3/uL (ref 150.0–400.0)
RBC: 5 Mil/uL (ref 4.22–5.81)
RDW: 13.3 % (ref 11.5–15.5)
WBC: 7.9 10*3/uL (ref 4.0–10.5)

## 2018-05-27 LAB — TSH: TSH: 0.91 u[IU]/mL (ref 0.35–4.50)

## 2018-05-27 LAB — BASIC METABOLIC PANEL
BUN: 17 mg/dL (ref 6–23)
CALCIUM: 9.5 mg/dL (ref 8.4–10.5)
CHLORIDE: 100 meq/L (ref 96–112)
CO2: 31 mEq/L (ref 19–32)
CREATININE: 1.14 mg/dL (ref 0.40–1.50)
GFR: 75.56 mL/min (ref >60.00)
Glucose, Bld: 122 mg/dL — ABNORMAL HIGH (ref 70–99)
Potassium: 3.9 mEq/L (ref 3.5–5.1)
Sodium: 140 mEq/L (ref 135–145)

## 2018-05-27 MED ORDER — ESCITALOPRAM OXALATE 10 MG PO TABS: ORAL_TABLET | ORAL | 1 refills | Status: DC

## 2018-05-27 NOTE — Assessment & Plan Note (Signed)
Reasonable check routine labs today.  See notes on labs.  Reasonable to exclude other possible causes for fatigue.  More likely that he does have depressive symptoms.  Discussed.  If patient is going to start treatment for depression then reasonable to consider SSRI.  Routine cautions and side effects and instructions discussed regarding SSRIs.  I did not send in prescription at time of office visit.  I want to see his labs first.  Discussed limiting alcohol on principal.  He is not using any illicits.  No suicidal homicidal intent.  Still contracts for safety.  Okay for outpatient follow-up. >25 minutes spent in face to face time with patient, >50% spent in counselling or coordination of care.

## 2018-09-08 ENCOUNTER — Telehealth: Payer: Self-pay | Admitting: Family Medicine

## 2018-09-08 DIAGNOSIS — M79673 Pain in unspecified foot: Secondary | ICD-10-CM

## 2018-09-08 NOTE — Telephone Encounter (Signed)
Pt is having problem with his foot and want to be referred to a podiatrist. Please advise,

## 2018-09-08 NOTE — Telephone Encounter (Signed)
I put in the referral.  Thanks.  He should get a call about that as soon as the referral department can make arrangements.  They are working as hard as they can, but they have the light of recent orders.

## 2018-09-13 NOTE — Telephone Encounter (Signed)
Patient advised.

## 2018-09-17 ENCOUNTER — Encounter: Payer: Self-pay | Admitting: Podiatry

## 2018-09-17 ENCOUNTER — Ambulatory Visit (INDEPENDENT_AMBULATORY_CARE_PROVIDER_SITE_OTHER): Payer: PRIVATE HEALTH INSURANCE | Admitting: Podiatry

## 2018-09-17 ENCOUNTER — Other Ambulatory Visit: Payer: Self-pay | Admitting: Podiatry

## 2018-09-17 ENCOUNTER — Ambulatory Visit (INDEPENDENT_AMBULATORY_CARE_PROVIDER_SITE_OTHER): Payer: PRIVATE HEALTH INSURANCE

## 2018-09-17 VITALS — BP 143/85 | HR 97

## 2018-09-17 DIAGNOSIS — M722 Plantar fascial fibromatosis: Secondary | ICD-10-CM

## 2018-09-17 DIAGNOSIS — M79672 Pain in left foot: Secondary | ICD-10-CM

## 2018-09-17 MED ORDER — DICLOFENAC SODIUM 75 MG PO TBEC: 75.0000 mg | DELAYED_RELEASE_TABLET | Freq: Two times a day (BID) | ORAL | 2 refills | Status: DC

## 2018-09-17 MED ORDER — TRIAMCINOLONE ACETONIDE 10 MG/ML IJ SUSP
10.0000 mg | Freq: Once | INTRAMUSCULAR | Status: AC
  Administered 2018-09-17: 10 mg

## 2018-09-17 NOTE — Progress Notes (Signed)
Subjective:   Patient ID: Roger Reed, male   DOB: 43 y.o.   MRN: [id number]   HPI Patient presents with 2-year history of exquisite discomfort in the left heel and states it is been very tender and making it hard to walk.  Patient states over the last 6 months it is gotten much worse and he does have a job where he is on his foot at all times now.  Patient does not smoke likes to be active   Review of Systems  All other systems reviewed and are negative.       Objective:  Physical Exam Vitals signs and nursing note reviewed.  Constitutional:      Appearance: He is well-developed.  Pulmonary:     Effort: Pulmonary effort is normal.  Musculoskeletal: Normal range of motion.  Skin:    General: Skin is warm.  Neurological:     Mental Status: He is alert.     Neurovascular status intact muscle strength is adequate range of motion within normal limits with patient found to have exquisite discomfort left plantar heel at the insertion of the tendon calcaneus with fluid buildup around the medial band.  Patient is found to have good digital perfusion is well oriented x3 with moderate depression of the arch     Assessment:  Acute plantar fasciitis left with inflammation fluid buildup     Plan:  H&P condition reviewed and today I injected the plantar fascia 3 mg Kenalog 5 mg Xylocaine applied fascial brace with instructions on usage and instructed on oral anti-inflammatories diclofenac 75 mg twice daily.  Gave instructions for shoe gear modifications and stretch and patient will be seen back for [place] to recheck  X-rays indicate spur formation with no indications of stress fracture arthritis

## 2018-09-17 NOTE — Patient Instructions (Signed)

## 2018-10-01 ENCOUNTER — Ambulatory Visit (INDEPENDENT_AMBULATORY_CARE_PROVIDER_SITE_OTHER): Payer: PRIVATE HEALTH INSURANCE | Admitting: Podiatry

## 2018-10-01 ENCOUNTER — Encounter: Payer: Self-pay | Admitting: Podiatry

## 2018-10-01 DIAGNOSIS — M722 Plantar fascial fibromatosis: Secondary | ICD-10-CM | POA: Diagnosis not present

## 2018-10-01 NOTE — Progress Notes (Signed)
Subjective:   Patient ID: Roger Reed, male   DOB: 43 y.o.   MRN: [id number]   HPI Patient states the heel is improved but still present with pain that has gotten quite a bit better but still has chronic nature to this with 2-year history   ROS      Objective:  Physical Exam  Plantar fasciitis still noted left with improvement with moderate depression of the arch and narrowness of the heel     Assessment:  Acute plantar fasciitis that is improved but still present left with mechanical dysfunction and 2-year history of problem     Plan:  H&P condition reviewed and discussed continue physical therapy anti-inflammatory supportive shoe gear therapy and I went ahead and I scanned for customized orthotics to try to reduce the plantar stresses against the arch.  Patient will be seen back when ready

## 2018-10-22 ENCOUNTER — Encounter: Payer: PRIVATE HEALTH INSURANCE | Admitting: Orthotics

## 2018-11-01 ENCOUNTER — Telehealth: Payer: Self-pay | Admitting: Family Medicine

## 2018-11-01 ENCOUNTER — Encounter: Payer: Self-pay | Admitting: *Deleted

## 2018-11-01 MED ORDER — ESCITALOPRAM OXALATE 10 MG PO TABS: ORAL_TABLET | ORAL | 0 refills | Status: DC

## 2018-11-01 NOTE — Telephone Encounter (Signed)
Rx sent 

## 2018-11-01 NOTE — Telephone Encounter (Signed)
Pt need refill for    Lexapro 10mg    Sent to [facility]

## 2018-11-12 ENCOUNTER — Ambulatory Visit (INDEPENDENT_AMBULATORY_CARE_PROVIDER_SITE_OTHER): Payer: PRIVATE HEALTH INSURANCE | Admitting: Family Medicine

## 2018-11-12 ENCOUNTER — Encounter: Payer: Self-pay | Admitting: Family Medicine

## 2018-11-12 ENCOUNTER — Other Ambulatory Visit: Payer: Self-pay

## 2018-11-12 DIAGNOSIS — R5383 Other fatigue: Secondary | ICD-10-CM | POA: Diagnosis not present

## 2018-11-12 MED ORDER — DICLOFENAC SODIUM 75 MG PO TBEC: 75.0000 mg | DELAYED_RELEASE_TABLET | Freq: Two times a day (BID) | ORAL | Status: AC

## 2018-11-12 MED ORDER — ESCITALOPRAM OXALATE 10 MG PO TABS: ORAL_TABLET | ORAL | 3 refills | Status: AC

## 2018-11-12 NOTE — Patient Instructions (Signed)
If your fatigue doesn't get better when your sleep schedule normalizes, then let me know.  Don't change your meds for now.  Update me as needed.  Thanks for your effort.   Take care.  Glad to see you.

## 2018-11-12 NOTE — Progress Notes (Signed)
His daughter has CIDP and has f/u at [facility] pending.    His foot pain is better, improved from prev.  He is still stretching.   His wife is finishing her dissertation and that is a noted change.  Their schedule may improve when she is done with her dissertation.  His mood is better but he still has fatigue.  He thought lexapro helped with his mood.  No SI/HI.  He has activity that he still enjoys, has things to look forward to.     He is trying to exercise, work on diet.  D/w pt about lab eval, etc. He doesn't have known OSA.  He isn't sleeping well, having to use Unisom at night.  He is waking at night to help with his kids but that should improve next month with his wife's schedule normalizing.    Meds, vitals, and allergies reviewed.   ROS: Per HPI unless specifically indicated in ROS section   GEN: nad, alert and oriented HEENT: mucous membranes moist NECK: supple w/o LA CV: rrr PULM: ctab, no inc wob ABD: soft, +bs EXT: no edema SKIN: Well-perfused.

## 2018-11-14 NOTE — Assessment & Plan Note (Signed)
His mood is better but he still has fatigue.  He thought lexapro helped with his mood.  No SI/HI.  He has activity that he still enjoys, has things to look forward to.    Reasonable to continue Lexapro as is for now.  He is trying to exercise, work on diet.  D/w pt about lab eval, etc, to be done at some point if needed. He doesn't have known OSA.  He isn't sleeping well, having to use Unisom at night.  He is waking at night to help with his kids but that should improve next month with his wife's schedule normalizing.   It likely makes sense for him to get through the next period of time, until his wife's work situation will normalize.  That may help his sleep pattern and that may help his fatigue.  If he does not improve after that he will let me know.  Okay for outpatient follow-up.  He agrees with plan.

## 2018-11-15 ENCOUNTER — Encounter: Payer: PRIVATE HEALTH INSURANCE | Admitting: Orthotics

## 2018-11-25 ENCOUNTER — Encounter: Payer: PRIVATE HEALTH INSURANCE | Admitting: Orthotics

## 2018-11-30 ENCOUNTER — Other Ambulatory Visit: Payer: Self-pay | Admitting: Family Medicine

## 2020-11-10 ENCOUNTER — Other Ambulatory Visit: Payer: Self-pay

## 2020-11-10 ENCOUNTER — Emergency Department
Admission: EM | Admit: 2020-11-10 | Discharge: 2020-11-11 | Disposition: A | Discharge status: Home / Self Care | Payer: PRIVATE HEALTH INSURANCE | Attending: Emergency Medicine | Admitting: Emergency Medicine

## 2020-11-10 DIAGNOSIS — T50905A Adverse effect of unspecified drugs, medicaments and biological substances, initial encounter: Secondary | ICD-10-CM

## 2020-11-10 DIAGNOSIS — R000 Tachycardia, unspecified: Secondary | ICD-10-CM | POA: Insufficient documentation

## 2020-11-10 DIAGNOSIS — T40715A Adverse effect of cannabis, initial encounter: Secondary | ICD-10-CM | POA: Insufficient documentation

## 2020-11-10 LAB — COMPREHENSIVE METABOLIC PANEL
ALT: 33 U/L (ref 0–44)
AST: 36 U/L (ref 15–41)
Albumin: 4.7 g/dL (ref 3.5–5.0)
Alkaline Phosphatase: 68 U/L (ref 38–126)
Anion gap: 10 (ref 5–15)
BUN: 20 mg/dL (ref 6–20)
CO2: 25 mmol/L (ref 22–32)
Calcium: 9.1 mg/dL (ref 8.9–10.3)
Chloride: 100 mmol/L (ref 98–111)
Creatinine, Ser: 1.27 mg/dL — ABNORMAL HIGH (ref 0.61–1.24)
GFR, Estimated: >60 mL/min (ref >60)
Glucose, Bld: 208 mg/dL — ABNORMAL HIGH (ref 70–99)
Potassium: 3.6 mmol/L (ref 3.5–5.1)
Sodium: 135 mmol/L (ref 135–145)
Total Bilirubin: 1 mg/dL (ref 0.3–1.2)
Total Protein: 7.5 g/dL (ref 6.5–8.1)

## 2020-11-10 LAB — CBC
HCT: 42.2 % (ref 39.0–52.0)
Hemoglobin: 14.5 g/dL (ref 13.0–17.0)
MCH: 30.3 pg (ref 26.0–34.0)
MCHC: 34.4 g/dL (ref 30.0–36.0)
MCV: 88.3 fL (ref 80.0–100.0)
Platelets: 172 10*3/uL (ref 150–400)
RBC: 4.78 MIL/uL (ref 4.22–5.81)
RDW: 12.2 % (ref 11.5–15.5)
WBC: 14.6 10*3/uL — ABNORMAL HIGH (ref 4.0–10.5)
nRBC: 0 % (ref 0.0–0.2)

## 2020-11-10 MED ORDER — LACTATED RINGERS IV BOLUS
1000.0000 mL | Freq: Once | INTRAVENOUS | Status: AC
  Administered 2020-11-10: 1000 mL via INTRAVENOUS

## 2020-11-10 MED ORDER — LORAZEPAM 2 MG/ML IJ SOLN
1.0000 mg | Freq: Once | INTRAMUSCULAR | Status: AC
  Administered 2020-11-10: 1 mg via INTRAVENOUS
  Filled 2020-11-10: qty 1

## 2020-11-10 MED ORDER — SODIUM CHLORIDE 0.9 % IV BOLUS
1000.0000 mL | Freq: Once | INTRAVENOUS | Status: AC
  Administered 2020-11-10: 1000 mL via INTRAVENOUS

## 2020-11-10 NOTE — Discharge Instructions (Addendum)
I would recommend stopping and avoiding any Delta 8 or other THC supplements  Dispose of the supply/sample that you took today

## 2020-11-10 NOTE — ED Triage Notes (Addendum)
Pt states he took "Delta 8 concentrate drops" and had N/V and anxiety. Per triage RN, pt was found in the bathroom with vomit surrounding. Pt is diaphoretic, tremulous. Pt is AOX4, appears anxious.

## 2020-11-10 NOTE — ED Provider Notes (Signed)
[facility] Emergency Department Provider Note  ____________________________________________   Event Date/Time   First MD Initiated Contact with Patient 11/10/20 2152     (approximate)  I have reviewed the triage vital signs and the nursing notes.   HISTORY  Chief Complaint Nausea    HPI Roger Reed is a 45 y.o. male  Here with anxiety. Pt states he took a small drop of delta 8 concentrate (mixed w/ coconut oil) this afternoon. He didn't feel much so he took a second drop after about 1 hr. He states that his father also took some, and he was here in the ED with his father when he began to feel anxious, nauseous, and lightheaded. He states his main concern is his father and he feels anxious. He feels like his heart is beating quickly and endorses dry mouth. No CP. No abd pain. No other complaints. No cardiac history. No other complaints. This was accidental.        Past Medical History:  Diagnosis Date  . Dysrhythmia   . GERD (gastroesophageal reflux disease)   . History of chicken pox   . Irregular heart rate    2012  . Kidney stones    2014- [facility] ([facility])    Patient Active Problem List   Diagnosis Date Noted  . Other fatigue 05/27/2018  . Leg pain 12/31/2016  . Atypical chest pain 12/31/2016  . Tennis elbow 10/21/2015  . Right lower quadrant abdominal pain 02/15/2013  . Nephrolithiasis 11/25/2012  . Back pain 05/05/2012  . Forearm swelling 12/23/2011  . Vertigo 06/05/2011  . Tachycardia 06/05/2011    Past Surgical History:  Procedure Laterality Date  . TONSILLECTOMY AND ADENOIDECTOMY  1984    Prior to Admission medications   Medication Sig Start Date End Date Taking? Authorizing Provider  diclofenac (VOLTAREN) 75 MG EC tablet Take 1 tablet (75 mg total) by mouth 2 (two) times daily. 11/12/18   Joaquim Nam, MD  dimenhyDRINATE (DRAMAMINE) 50 MG tablet Take 50 mg by mouth every 8 (eight) hours as needed.    [provider]  escitalopram (LEXAPRO) 10 MG tablet Take 5 mg a day for 2 weeks then increase to 10 mg a day. 11/12/18   Joaquim Nam, MD    Allergies Patient has no known allergies.  Family History  Problem Relation Age of Onset  . Diabetes Other   . Cancer Other        Brain  . Alcohol abuse Other   . Drug abuse Other   . Diabetes Other   . Arthritis Other   . Stroke Other   . Diabetes Other     Social History Social History   Tobacco Use  . Smoking status: Never Smoker  . Smokeless tobacco: Never Used  Substance Use Topics  . Alcohol use: No    Alcohol/week: 0.0 standard drinks    Comment: occ  . Drug use: No    Review of Systems  Review of Systems  Constitutional: Negative for chills, fatigue and fever.  HENT: Negative for sore throat.   Respiratory: Negative for shortness of breath.   Cardiovascular: Negative for chest pain.  Gastrointestinal: Negative for abdominal pain.  Genitourinary: Negative for flank pain.  Musculoskeletal: Negative for neck pain.  Skin: Negative for rash and wound.  Allergic/Immunologic: Negative for immunocompromised state.  Neurological: Negative for weakness and numbness.  Hematological: Does not bruise/bleed easily.  Psychiatric/Behavioral: The patient is nervous/anxious.  ____________________________________________  PHYSICAL EXAM:      VITAL SIGNS: ED Triage Vitals  Enc Vitals Group     BP      Pulse      Resp      Temp      Temp src      SpO2      Weight      Height      Head Circumference      Peak Flow      Pain Score      Pain Loc      Pain Edu?      Excl. in GC?      Physical Exam Vitals and nursing note reviewed.  Constitutional:      General: He is not in acute distress.    Appearance: He is well-developed.  HENT:     Head: Normocephalic and atraumatic.  Eyes:     Conjunctiva/sclera: Conjunctivae normal.  Cardiovascular:     Rate and Rhythm: Regular rhythm. Tachycardia present.      Heart sounds: Normal heart sounds. No murmur heard. No friction rub.  Pulmonary:     Effort: Pulmonary effort is normal. No respiratory distress.     Breath sounds: Normal breath sounds. No wheezing or rales.  Abdominal:     General: There is no distension.     Palpations: Abdomen is soft.     Tenderness: There is no abdominal tenderness.  Musculoskeletal:     Cervical back: Neck supple.  Skin:    General: Skin is warm.     Capillary Refill: Capillary refill takes less than 2 seconds.  Neurological:     Mental Status: He is alert and oriented to person, place, and time.     Motor: No abnormal muscle tone.  Psychiatric:        Mood and Affect: Mood is anxious.       ____________________________________________   LABS (all labs ordered are listed, but only abnormal results are displayed)  Labs Reviewed  CBC - Abnormal; Notable for the following components:      Result Value   WBC 14.6 (*)    All other components within normal limits  COMPREHENSIVE METABOLIC PANEL - Abnormal; Notable for the following components:   Glucose, Bld 208 (*)    Creatinine, Ser 1.27 (*)    All other components within normal limits    ____________________________________________  EKG: Sinus tachycardia, VR 131. QRS 96, QTc 474. Borderline TWA, no acute ischemic changes. ________________________________________  RADIOLOGY All imaging, including plain films, CT scans, and ultrasounds, independently reviewed by me, and interpretations confirmed via formal radiology reads.  ED MD interpretation:   None  Official radiology report(s): No results found.  ____________________________________________  PROCEDURES   Procedure(s) performed (including Critical Care):  Procedures  ____________________________________________  INITIAL IMPRESSION / MDM / ASSESSMENT AND PLAN / ED COURSE  As part of my medical decision making, I reviewed the following data within the electronic MEDICAL RECORD NUMBER  Nursing notes reviewed and incorporated, Old chart reviewed, Notes from prior ED visits, and [place] Controlled Substance Database       *[name] was evaluated in Emergency Department on 11/10/2020 for the symptoms described in the history of present illness. He was evaluated in the context of the global COVID-19 pandemic, which necessitated consideration that the patient might be at risk for infection with the SARS-CoV-2 virus that causes COVID-19. Institutional protocols and algorithms that pertain to the evaluation of patients at risk for COVID-19  are in a state of rapid change based on information released by regulatory bodies including the CDC and federal and state organizations. These policies and algorithms were followed during the patient's care in the ED.  Some ED evaluations and interventions may be delayed as a result of limited staffing during the pandemic.*     Medical Decision Making:  45 yo M here with adverse effect of delta 8 THC. Pt anxious, tachycardic on arrival, likely made worse by his h/o anxiety. Labs unremarkable. Mild leukocytosis is likely reactive. CMP with slight Cr elevation - IVF given. Discussed with pt's wife via telephone. Pt had been feeling well prior to this, though his anxiety has been worsening (which is why he was trying the delta 8). No recent infectious sx.  Will give fluids, ativan. Plan to d/c once improved and no longer intoxicated. Will encourage him to f/u with his psychiatrist re: his worsening anxiety. No SI, HI, AVH, or indications for emergent psych evaluation.  ____________________________________________  FINAL CLINICAL IMPRESSION(S) / ED DIAGNOSES  Final diagnoses:  Adverse effect of drug, initial encounter     MEDICATIONS GIVEN DURING THIS VISIT:  Medications  lactated ringers bolus 1,000 mL (has no administration in time range)  sodium chloride 0.9 % bolus 1,000 mL (1,000 mLs Intravenous New Bag/Given 11/10/20 2213)  LORazepam  (ATIVAN) injection 1 mg (1 mg Intravenous Given 11/10/20 2213)     ED Discharge Orders    None       Note:  This document was prepared using Dragon voice recognition software and may include unintentional dictation errors.   Shaune Pollack, MD 11/10/20 [phone]

## 2020-11-10 NOTE — ED Notes (Signed)
Pt provided urinal.

## 2020-11-11 NOTE — ED Provider Notes (Signed)
12:00 AM  Assumed care.  Patient here after accidental THC overdose.  Patient has a minimally elevated white count and creatinine.  He is tachycardic but otherwise hemodynamically stable.  Getting IV fluids, Ativan.  Will need to be monitored but anticipate discharge home with sober driver.  1:35 AM  Pt's vital signs have improved.  Heart rate in the low 100s.  Resting comfortably.  Currently asymptomatic.  Able to tolerate p.o. and ambulate.  I feel he is safe to be discharged home with a sober driver.  At this time, I do not feel there is any life-threatening condition present. I have reviewed, interpreted and discussed all results (EKG, imaging, lab, urine as appropriate) and exam findings with patient/family. I have reviewed nursing notes and appropriate previous records.  I feel the patient is safe to be discharged home without further emergent workup and can continue workup as an outpatient as needed. Discussed usual and customary return precautions. Patient/family verbalize understanding and are comfortable with this plan.  Outpatient follow-up has been provided as needed. All questions have been answered.    Lille Karim, Layla Maw, DO 11/11/20 [phone]

## 2020-11-11 NOTE — ED Notes (Signed)
Pt ambulated unassisted. Pt states he is feeling "much better now."
